# Patient Record
Sex: Female | Born: 1985
Health system: Southern US, Community
[De-identification: ages and names within clinical notes are randomized; demographics above are authoritative.]

## PROBLEM LIST (undated history)

## (undated) DIAGNOSIS — K5120 Ulcerative (chronic) proctitis without complications: Secondary | ICD-10-CM

## (undated) HISTORY — DX: Ulcerative (chronic) proctitis without complications: K51.20

---

## 2010-05-13 IMAGING — CT CT NECK W/ CM
3 series · 16 of 33 positions shown, 19 images · IV contrast (agent unspecified)
Comparison: None available.

CLINICAL DATA: Throat swelling.  Question mass.

CT NECK WITH CONTRAST
TECHNIQUE: Multidetector CT imaging of the neck was performed with
intravenous contrast.
Contrast: 80 ml [id number].

[Series 2: 2cc/(id)/1cc/(id) · axial · 0.47mm/px · z∈[-338,-136]mm · 8 of 64 slices shown, 10 images]
[im 5/64  soft-tissue]
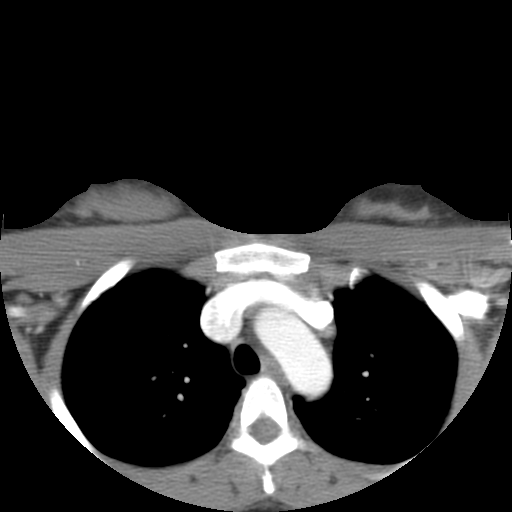
[im 5/64  bone]
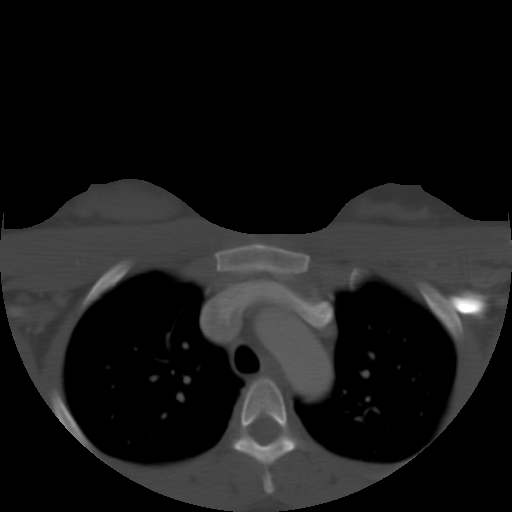
[im 15/64  bone]
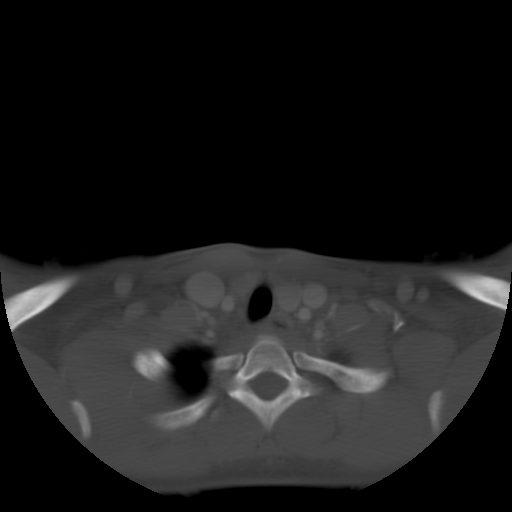
[im 20/64  bone]
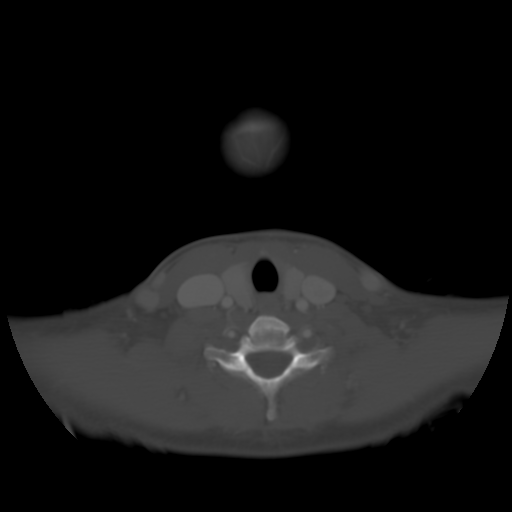
[im 30/64  bone]
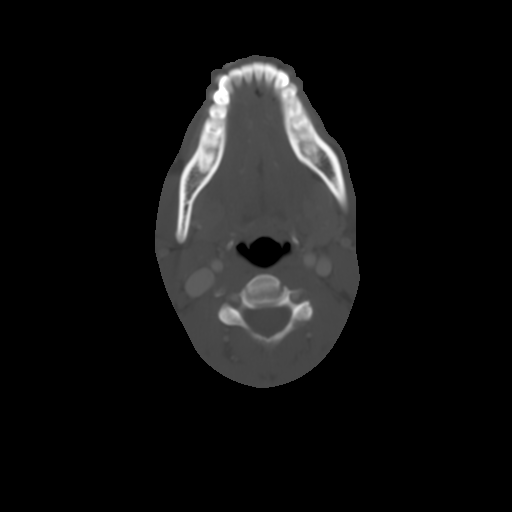
[im 34/64  soft-tissue]
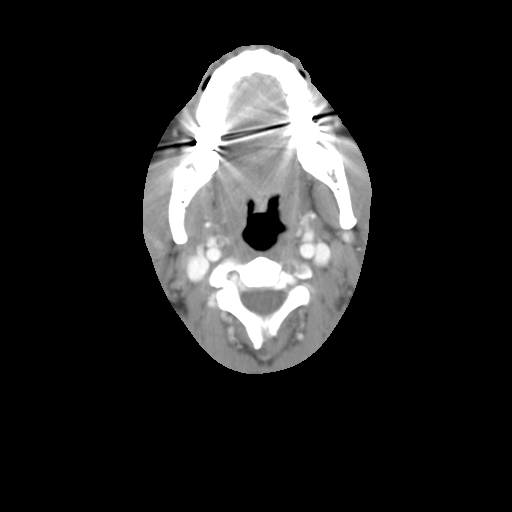
[im 34/64  bone]
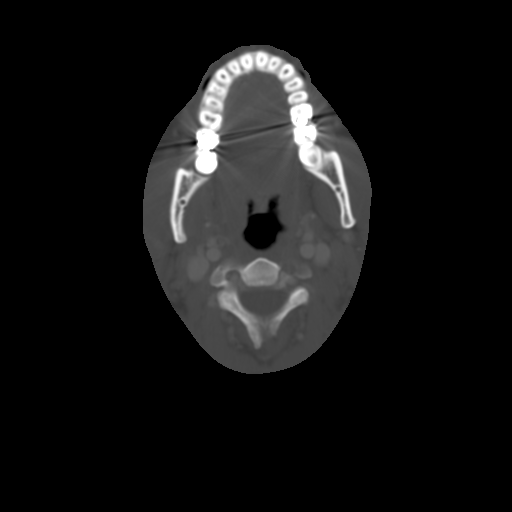
[im 44/64  bone]
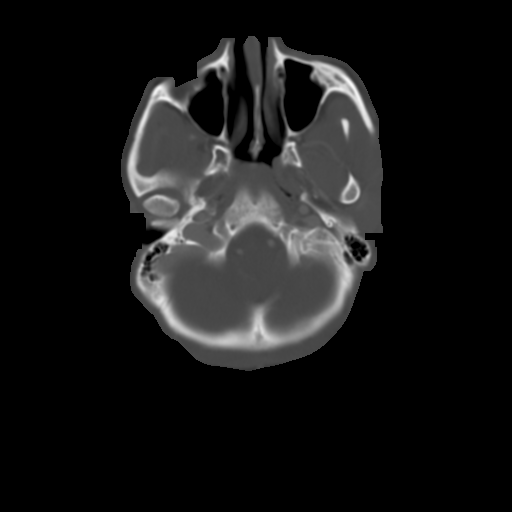
[im 49/64  bone]
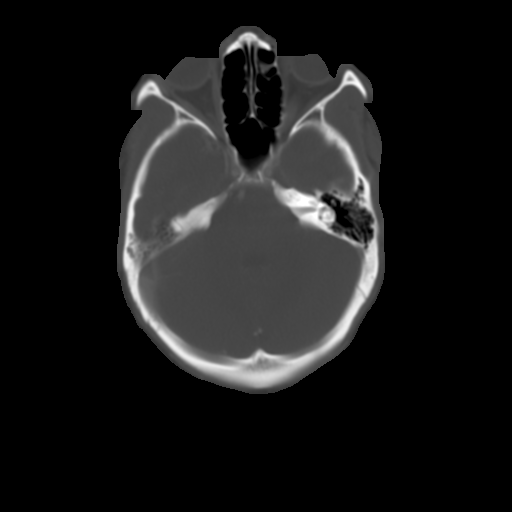
[im 59/64  bone]
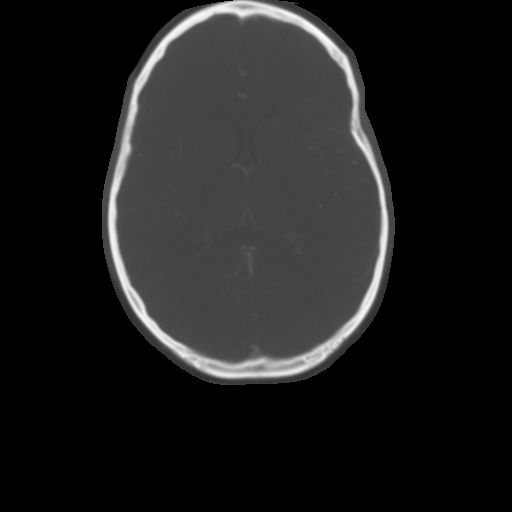

[Series 300: reformatted · coronal · 0.48mm/px · 3 of 108 slices shown (1 of 2)]
[im 37/108  bone]
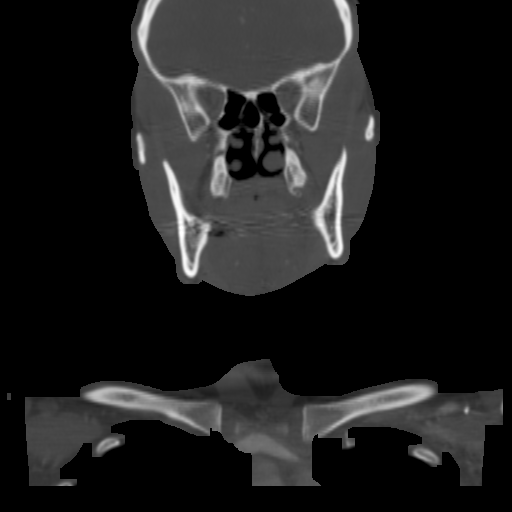
[im 48/108  bone]
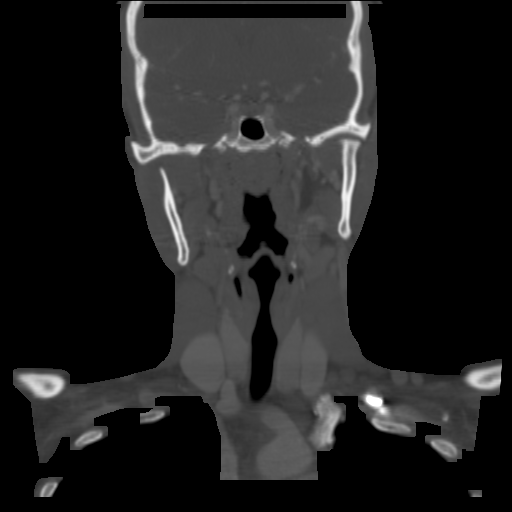
[im 59/108  bone]
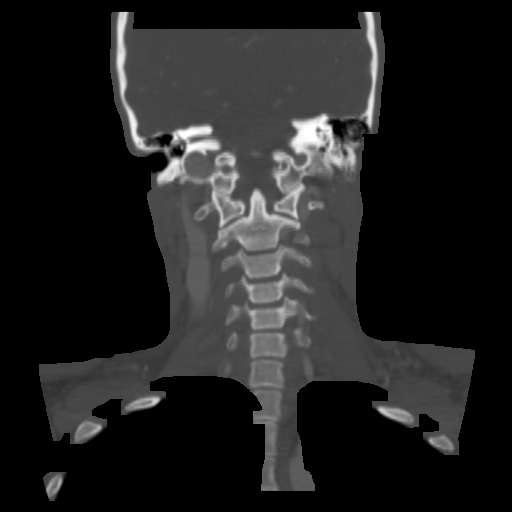

[Series 301: reformatted · sagittal · 0.48mm/px · 5 of 80 slices shown, 6 images (2 of 2)]
[im 27/80  bone]
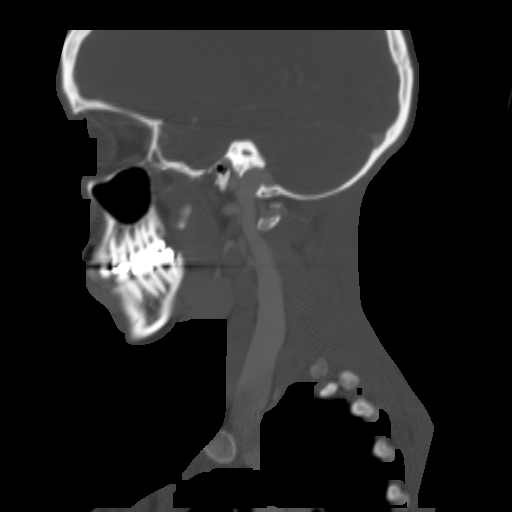
[im 33/80  bone]
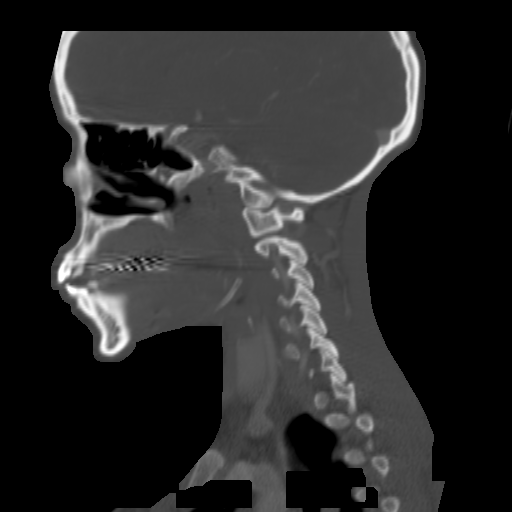
[im 40/80  soft-tissue]
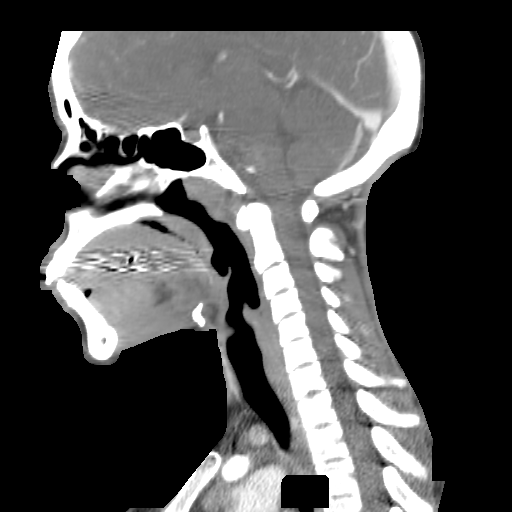
[im 40/80  bone]
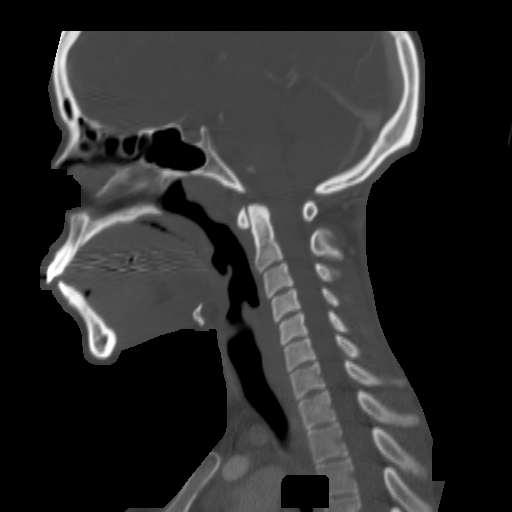
[im 47/80  bone]
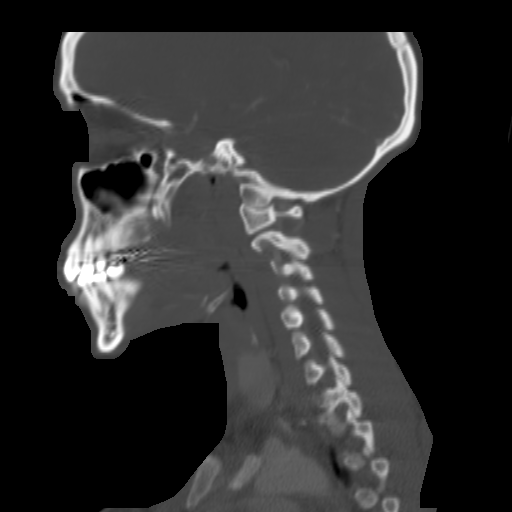
[im 53/80  bone]
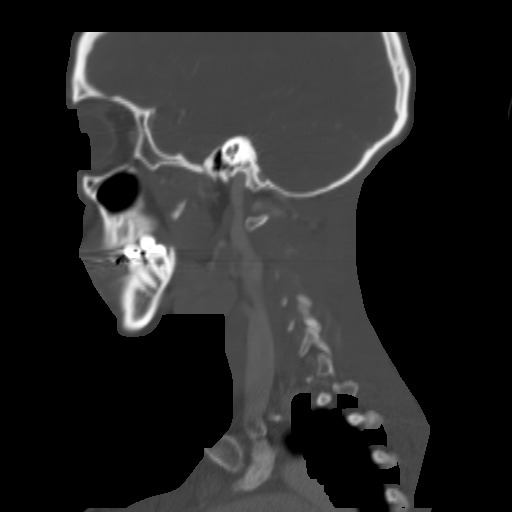

[16 of 33 positions shown; findings below may reference images not displayed]

FINDINGS: A few small lymph nodes are identified the neck but no
mass is seen.  The major salivary glands appear normal.  The
aerodigestive tract is unremarkable.  No abscess or other fluid
collection is identified.  Major caliber vascular structures are
patent.  The thyroid gland appears normal.  Visualized mediastinal
contents are unremarkable.  Imaged intracranial contents appear
normal.  Lung apices are clear.  There is no focal bony
abnormality.
IMPRESSION: Negative exam.  No mass identified.

## 2011-01-08 HISTORY — PX: KELOID EXCISION: SHX1856

## 2015-02-21 ENCOUNTER — Ambulatory Visit (FREE_STANDING_LABORATORY_FACILITY): Payer: No Typology Code available for payment source | Admitting: Family Medicine

## 2015-02-21 ENCOUNTER — Encounter (INDEPENDENT_AMBULATORY_CARE_PROVIDER_SITE_OTHER): Payer: Self-pay | Admitting: Family Medicine

## 2015-02-21 VITALS — BP 119/76 | HR 81 | Temp 98.5°F | Resp 12 | Ht 59.06 in | Wt 132.0 lb

## 2015-02-21 DIAGNOSIS — N6011 Diffuse cystic mastopathy of right breast: Secondary | ICD-10-CM

## 2015-02-21 DIAGNOSIS — Z113 Encounter for screening for infections with a predominantly sexual mode of transmission: Secondary | ICD-10-CM

## 2015-02-21 DIAGNOSIS — N6012 Diffuse cystic mastopathy of left breast: Secondary | ICD-10-CM

## 2015-02-21 DIAGNOSIS — Z0000 Encounter for general adult medical examination without abnormal findings: Secondary | ICD-10-CM

## 2015-02-21 LAB — HIV AG/AB 4TH GENERATION: HIV Ag/Ab, 4th Generation: NONREACTIVE

## 2015-02-21 LAB — COMPREHENSIVE METABOLIC PANEL
ALT: 18 U/L (ref 0–55)
AST (SGOT): 22 U/L (ref 5–34)
Albumin/Globulin Ratio: 1.3 (ref 0.9–2.2)
Albumin: 4 g/dL (ref 3.5–5.0)
Alkaline Phosphatase: 65 U/L (ref 37–106)
BUN: 25 mg/dL — ABNORMAL HIGH (ref 7.0–19.0)
Bilirubin, Total: 0.7 mg/dL (ref 0.1–1.2)
CO2: 25 meq/L (ref 21–30)
Calcium: 9.6 mg/dL (ref 8.5–10.5)
Chloride: 105 meq/L (ref 100–111)
Creatinine: 0.8 mg/dL (ref 0.4–1.5)
Globulin: 3.1 g/dL (ref 2.0–3.7)
Glucose: 74 mg/dL (ref 70–100)
Potassium: 4.1 meq/L (ref 3.5–5.3)
Protein, Total: 7.1 g/dL (ref 6.0–8.3)
Sodium: 139 meq/L (ref 135–146)

## 2015-02-21 LAB — LIPID PANEL
Cholesterol / HDL Ratio: 2.7
Cholesterol: 195 mg/dL (ref 0–199)
HDL: 73 mg/dL (ref 40–9999)
LDL Calculated: 114 mg/dL — ABNORMAL HIGH (ref 0–99)
Triglycerides: 38 mg/dL (ref 34–149)
VLDL Calculated: 8 mg/dL — ABNORMAL LOW (ref 10–40)

## 2015-02-21 LAB — HEMOLYSIS INDEX: Hemolysis Index: 10 (ref 0–18)

## 2015-02-21 LAB — CBC AND DIFFERENTIAL
Basophils Absolute Automated: 0.01 10*3/uL (ref 0.00–0.20)
Basophils Automated: 0 %
Eosinophils Absolute Automated: 0.07 10*3/uL (ref 0.00–0.70)
Eosinophils Automated: 2 %
Hematocrit: 38.8 % (ref 37.0–47.0)
Hgb: 13.2 g/dL (ref 12.0–16.0)
Immature Granulocytes Absolute: 0 10*3/uL
Immature Granulocytes: 0 %
Lymphocytes Absolute Automated: 1.86 10*3/uL (ref 0.50–4.40)
Lymphocytes Automated: 40 %
MCH: 31.2 pg (ref 28.0–32.0)
MCHC: 34 g/dL (ref 32.0–36.0)
MCV: 91.7 fL (ref 80.0–100.0)
MPV: 11.3 fL (ref 9.4–12.3)
Monocytes Absolute Automated: 0.33 10*3/uL (ref 0.00–1.20)
Monocytes: 7 %
Neutrophils Absolute: 2.35 10*3/uL (ref 1.80–8.10)
Neutrophils: 51 %
Nucleated RBC: 0 /100 WBC (ref 0–1)
Platelets: 274 10*3/uL (ref 140–400)
RBC: 4.23 10*6/uL (ref 4.20–5.40)
RDW: 13 % (ref 12–15)
WBC: 4.62 10*3/uL (ref 3.50–10.80)

## 2015-02-21 LAB — VITAMIN D,25 OH,TOTAL: Vitamin D, 25 OH, Total: 34 ng/mL (ref 30–100)

## 2015-02-21 LAB — GFR: EGFR: >60

## 2015-02-21 LAB — TSH: TSH: 1.43 u[IU]/mL (ref 0.35–4.94)

## 2015-02-21 LAB — HEPATITIS B SURFACE ANTIGEN W/ REFLEX TO CONFIRMATION: Hepatitis B Surface Antigen: NONREACTIVE

## 2015-02-21 NOTE — Progress Notes (Signed)
Subjective:    Date: 02/21/2015 8:47 AM   Patient ID: Katherine Evans is a 30 y.o. female.    Chief Complaint   Patient presents with   . Annual Exam     physical      HPI  New patient.  Annual physical.   Exercise -Cross fit 4 times a week.   Diet: "clean" diet except once or twice a week.  Glass of wine once a day.  Gyn:  Not sexually active at this time.  No previous pregnancy.  Regular menses.  Immunizations: pt states had tetanus vaccine within 10 years, will provide records.    There are no active problems to display for this patient.    No current outpatient prescriptions on file.     No current facility-administered medications for this visit.     No Known Allergies     History reviewed. No pertinent past medical history.     Past Surgical History   Procedure Laterality Date   . Cosmetic surgery Bilateral 2013     ears     Family History   Problem Relation Age of Onset   . No known problems Mother    . No known problems Father    . Diabetes Paternal Grandfather    . Heart disease Paternal Grandfather    . Inflammatory bowel disease Paternal Grandfather      Social History     Social History   . Marital Status: Single     Spouse Name: N/A   . Number of Children: N/A   . Years of Education: N/A     Occupational History   . Not on file.     Social History Main Topics   . Smoking status: Never Smoker    . Smokeless tobacco: Never Used   . Alcohol Use: Yes      Comment: occassional wine   . Drug Use: No   . Sexual Activity:     Partners: Male     Other Topics Concern   . Not on file     Social History Narrative   . No narrative on file     The following portions of the patient's history were reviewed and updated as appropriate: allergies, current medications, past family history, past medical history, past social history, past surgical history and problem list.    Review of Systems  ROS performed (handout given - General sypmtoms, eyes, ears/nose/mouth & throat, breasts, lungs & heart, skin, neurological,  abdomen, sleep, musculoskeletal, mood, men only, women only, period questions, other) and positives as above (none)    Objective:   BP 119/76 mmHg  Pulse 81  Temp(Src) 98.5 F (36.9 C) (Oral)  Resp 12  Ht 1.5 m (4' 11.06")  Wt 59.875 kg (132 lb)  BMI 26.61 kg/m2  LMP 02/06/2015  Wt Readings from Last 3 Encounters:   02/21/15 59.875 kg (132 lb)     Physical Exam   Constitutional: She is oriented to person, place, and time. She appears well-developed and well-nourished. She is cooperative. No distress.   HENT:   Head: Normocephalic and atraumatic.   Right Ear: Hearing, tympanic membrane, external ear and ear canal normal.   Left Ear: Hearing, tympanic membrane, external ear and ear canal normal.   Mouth/Throat: Uvula is midline, oropharynx is clear and moist and mucous membranes are normal.   Eyes: Conjunctivae and EOM are normal. Pupils are equal, round, and reactive to light.   Neck: Trachea normal. Neck supple.  Carotid bruit is not present. No edema present. No thyroid mass and no thyromegaly present.   Cardiovascular: Normal rate, regular rhythm, S1 normal, S2 normal and intact distal pulses.    No murmur heard.  Pulmonary/Chest: Effort normal and breath sounds normal. She has no decreased breath sounds. She has no wheezes. She has no rhonchi. She has no rales. Right breast exhibits no inverted nipple and no nipple discharge. Left breast exhibits no inverted nipple and no nipple discharge. Breasts are symmetrical.   Fibrocystic breast tissue texture   Abdominal: Soft. Normal appearance and bowel sounds are normal. She exhibits no distension and no mass. There is no hepatosplenomegaly. There is no tenderness.   Lymphadenopathy:     She has no cervical adenopathy.     She has no axillary adenopathy.        Right axillary: No pectoral and no lateral adenopathy present.        Left axillary: No pectoral and no lateral adenopathy present.       Right: No supraclavicular adenopathy present.        Left: No  supraclavicular adenopathy present.   Neurological: She is alert and oriented to person, place, and time. She has normal reflexes.   Skin: Skin is warm, dry and intact. No rash noted. She is not diaphoretic.   Psychiatric: She has a normal mood and affect. Her speech is normal.   Nursing note and vitals reviewed.        Assessment/Plan:       1. Annual physical exam    2. Screening for STD (sexually transmitted disease)    3. Fibrocystic breast changes of both breasts      - CBC and differential  - Comprehensive metabolic panel  - Lipid panel  - Vitamin D,25 OH, Total  - TSH  - HIV Ag/Ab 4th generation  - CHLAMYDIA/GC BY PCR  - Hepatitis B (HBV) Surface Antigen    Further recommendations pending test results.   Handout given to patient on health maintenance/preventive care [facility] 2014; regular health exams, dental and eye exams,patient's BMI and BMI categories, recommended amount of ETOH; healthy diet, regular exercise, maintenance of healthy weight, avoidance of tobacco and street drugs, age specific preventive care, age and disease specific vaccine recommendations, and age specific cancer screening)      Return in about 1 year (around 02/21/2016), or if symptoms worsen or fail to improve, for annual physical.    Jules Husbands, DO

## 2015-02-21 NOTE — Patient Instructions (Signed)
Preventive Care for Adults, Female  [employer] Patient Information 2014 [employer]  Last revised: December 21, 2012  A healthy lifestyle and preventive care can promote health and wellness. Preventive health guidelines for women include the following key practices.  A routine yearly physical is a good way to check with your health care provider about your health and preventive screening. It is a chance to share any concerns and updates on your health and to receive a thorough exam.  Visit your dentist for a routine exam and preventive care every 6 months. Brush your teeth twice a day and floss once a day. Good oral hygiene prevents tooth decay and gum disease.  The frequency of eye exams is based on your age, health, family medical history, use of contact lenses, and other factors. Follow your health care provider's recommendations for frequency of eye exams.  Eat a healthy diet. Foods like vegetables, fruits, whole grains, low-fat dairy products, and lean protein foods contain the nutrients you need without too many calories. Decrease your intake of foods high in solid fats, added sugars, and salt. Eat the right amount of calories for you. Get information about a proper diet from your health care provider, if necessary.  Regular physical exercise is one of the most important things you can do for your health. Most adults should get at least 150 minutes of moderate-intensity exercise (any activity that increases your heart rate and causes you to sweat) each week. In addition, most adults need muscle-strengthening exercises on 2 or more days a week.  Maintain a healthy weight. The body mass index (BMI) is a screening tool to identify possible weight problems. It provides an estimate of body fat based on height and weight. Your health care provider can find your BMI, and can help you achieve or maintain a healthy weight. For adults 20 years and older:  A BMI below 18.5 is considered underweight.  A BMI of 18.5 to  24.9 is normal.  A BMI of 25 to 29.9 is considered overweight.  A BMI of 30 and above is considered obese.  Maintain normal blood lipids and cholesterol levels by exercising and minimizing your intake of saturated fat. Eat a balanced diet with plenty of fruit and vegetables. Blood tests for lipids and cholesterol should begin at age 20 and be repeated every 5 years. If your lipid or cholesterol levels are high, you are over 50, or you are at high risk for heart disease, you may need your cholesterol levels checked more frequently. Ongoing high lipid and cholesterol levels should be treated with medicines if diet and exercise are not working.  If you smoke, find out from your health care provider how to quit. If you do not use tobacco, do notstart.  Lung cancer screening is recommended for adults aged 55-80 years who are at high risk for developing lung cancer because of a history of smoking. A yearly low-dose CT scan of the lungs is recommended for people who have at least a 30-pack-year history of smoking and are a current smoker or have quit within the past 15 years. A pack year of smoking is smoking an average of 1 pack of cigarettes a day for 1 year (for example: 1 pack a day for 30 years or 2 packs a day for 15 years). Yearly screening should continue until the smoker has stopped smoking for at least 15 years. Yearly screening should be stopped for people who develop a health problem that would prevent them from   having lung cancer treatment.  If you are pregnant, do notdrink alcohol. If you are breastfeeding, be very cautious about drinking alcohol. If you are not pregnant and choose to drink alcohol, do nothave more than 1 drink per day. One drink is considered to be 12 ounces (355 mL) of beer, 5 ounces (148 mL) of wine, or 1.5 ounces (44 mL) of liquor.  Avoid use of street drugs. Do notshare needles with anyone. Ask for help if you need support or instructions about stopping the use of drugs.  High blood  pressure causes heart disease and increases the risk of stroke. Your blood pressure should be checked at least every 1 to 2 years. Ongoing high blood pressure should be treated with medicines if weight loss and exercise do not work.  If you are 55-79 years old, ask your health care provider if you should take aspirin to prevent strokes.  Diabetes screening involves taking a blood sample to check your fasting blood sugar level. This should be done once every 3 years, after age 45, if you are within normal weight and without risk factors for diabetes. Testing should be considered at a younger age or be carried out more frequently if you are overweight and have at least 1 risk factor for diabetes.  Breast cancer screening is essential preventive care for women. You should practice "breast self-awareness." This means understanding the normal appearance and feel of your breasts and may include breast self-examination. Any changes detected, no matter how small, should be reported to a health care provider. Women in their 20s and 30s should have a clinical breast exam (CBE) by a health care provider as part of a regular health exam every 1 to 3 years. After age 40, women should have a CBE every year. Starting at age 40, women should consider having a mammogram (breast X-ray test) every year. Women who have a family history of breast cancer should talk to their health care provider about genetic screening. Women at a high risk of breast cancer should talk to their health care providers about having an MRI and a mammogram every year.  Breast cancer gene (BRCA)-related cancer risk assessment is recommended for women who have family members withBRCA-related cancers.BRCA-related cancers include breast, ovarian, tubal, and peritoneal cancers. Having family members with these cancers may be associated with an increased risk for harmful changes (mutations) in the breast cancer genesBRCA1andBRCA2. Results of the assessment will  determine the need for genetic counseling andBRCA1andBRCA2testing.  The Pap test is a screening test for cervical cancer. A Pap test can show cell changes on the cervix that might become cervical cancer if left untreated. A Pap test is a procedure in which cells are obtained and examined from the lower end of the uterus (cervix).  Women should have a Pap test starting at age 21.  Between ages 21 and 29, Pap tests should be repeated every 2 years.  Beginning at age 30, you should have a Pap test every 3 years as long as the past 3 Pap tests have been normal.  Some women have medical problems that increase the chance of getting cervical cancer. Talk to your health care provider about these problems. It is especially important to talk to your health care provider if a new problem develops soon after your last Pap test. In these cases, your health care provider may recommend more frequent screening and Pap tests.  The above recommendations are the same for women who haveor have notgotten the vaccine   for human papillomavirus (HPV).  If you had a hysterectomy for a problem that was notcancer or a condition that could lead to cancer, then you no longer need Pap tests. Even if you no longer need a Pap test, a regular exam is a good idea to make sure no other problems are starting.  If you are between ages 65 and 70 years, and you have had normal Pap tests going back 10 years, you no longer need Pap tests. Even if you no longer need a Pap test, a regular exam is a good idea to make sure no other problems are starting.  If you have had past treatment for cervical cancer or a condition that could lead to cancer, you need Pap tests and screening for cancer for at least 20 years after your treatment.  If Pap tests have been discontinued, risk factors (such as a new sexual partner) need to be reassessed to determine if screening should be resumed.  The HPV test is an additional test that may be used for cervical cancer screening.  The HPV test looks for the virus that can cause the cell changes on the cervix. The cells collected during the Pap test can be tested for HPV. The HPV test could be used to screen women aged 30 years and older, and should be used in women of any age who have unclear Pap test results. After the age of 30, women should have HPV testing at the same frequency as a Pap test.  Colorectal cancer can be detected and often prevented. Most routine colorectal cancer screening begins at the age of 50 years and continues through age 75 years. However, your health care provider may recommend screening at an earlier age if you have risk factors for colon cancer. On a yearly basis, your health care provider may provide home test kits to check for hidden blood in the stool. Use of a small camera at the end of a tube, to directly examine the colon (sigmoidoscopy or colonoscopy), can detect the earliest forms of colorectal cancer. Talk to your health care provider about this at age 50, when routine screening begins. Direct exam of the colon should be repeated every 5-10 years through age 75 years, unless early forms of pre-cancerous polyps or small growths are found.  People who are at an increased risk for hepatitis B should be screened for this virus. You are considered at high risk for hepatitis B if:  You were born in a country where hepatitis B occurs often. Talk with your health care provider about which countries are considered high risk.  Your parents were born in a high-risk country and you have not received a shot to protect against hepatitis B (hepatitis B vaccine).  You have HIV or AIDS.  You use needles to inject street drugs.  You live with, or have sex with, someone who has Hepatitis B.  You get hemodialysis treatment.  You take certain medicines for conditions like cancer, organ transplantation, and autoimmune conditions.  Hepatitis C blood testing is recommended for all people born from 1945 through 1965 and any  individual with known risks for hepatitis C.  Practice safe sex. Use condoms and avoid high-risk sexual practices to reduce the spread of sexually transmitted infections (STIs). STIs include gonorrhea, chlamydia, syphilis, trichomonas, herpes, HPV, and human immunodeficiency virus (HIV). Herpes, HIV, and HPV are viral illnesses that have no cure. They can result in disability, cancer, and death. Sexually active women aged 25 years and   younger should be checked for chlamydia. Older women with new or multiple partners should also be tested for chlamydia. Testing for other STIs is recommended if you are sexually active and at increased risk.  Osteoporosis is a disease in which the bones lose minerals and strength with aging. This can result in serious bone fractures or breaks. The risk of osteoporosis can be identified using a bone density scan. Women ages 65 years and over and women at risk for fractures or osteoporosis should discuss screening with their health care providers. Ask your health care provider whether you should take a calcium supplement or vitamin D to reduce the rate of osteoporosis.  Menopause can be associated with physical symptoms and risks. Hormone replacement therapy is available to decrease symptoms and risks. You should talk to your health care provider about whether hormone replacement therapy is right for you.  Use sunscreen. Apply sunscreen liberally and repeatedly throughout the day. You should seek shade when your shadow is shorter than you. Protect yourself by wearing long sleeves, pants, a wide-brimmed hat, and sunglasses year round, whenever you are outdoors.  Once a month, do a whole body skin exam, using a mirror to look at the skin on your back. Tell your health care provider of new moles, moles that have irregular borders, moles that are larger than a pencil eraser, or moles that have changed in shape or color.  Stay current with required vaccines (immunizations).  Influenza  vaccine. All adults should be immunized every year.  Tetanus, diphtheria, and acellular pertussis (Td, Tdap) vaccine. Pregnant women should receive 1 dose of Tdap vaccine during each pregnancy. The dose should be obtained regardless of the length of time since the last dose. Immunization is preferred during the 27th-36th week of gestation. An adult who has not previously received Tdap or who does not know her vaccine status should receive 1 dose of Tdap. This initial dose should be followed by tetanus and diphtheria toxoids (Td) booster doses every 10 years. Adults with an unknown or incomplete history of completing a 3-dose immunization series with Td-containing vaccines should begin or complete a primary immunization series including a Tdap dose. Adults should receive a Td booster every 10 years.  Varicella vaccine. An adult without evidence of immunity to varicella should receive 2 doses or a second dose if she has previously received 1 dose. Pregnant females who do not have evidence of immunity should receive the first dose after pregnancy. This first dose should be obtained before leaving the health care facility. The second dose should be obtained 4-8 weeks after the first dose.  Human papillomavirus (HPV) vaccine. Females aged 13-26 years who have not received the vaccine previously should obtain the 3-dose series. The vaccine is not recommended for use in pregnant females. However, pregnancy testing is not needed before receiving a dose. If a female is found to be pregnant after receiving a dose, no treatment is needed. In that case, the remaining doses should be delayed until after the pregnancy. Immunization is recommended for any person with an immunocompromised condition through the age of 26 years if she did not get any or all doses earlier. During the 3-dose series, the second dose should be obtained 4-8 weeks after the first dose. The third dose should be obtained 24 weeks after the first dose and 16  weeks after the second dose.  Zoster vaccine. One dose is recommended for adults aged 60 years or older unless certain conditions are present.  Measles, mumps,   and rubella (MMR) vaccine. Adults born before 1957 generally are considered immune to measles and mumps. Adults born in 1957 or later should have 1 or more doses of MMR vaccine unless there is a contraindication to the vaccine or there is laboratory evidence of immunity to each of the three diseases. A routine second dose of MMR vaccine should be obtained at least 28 days after the first dose for students attending postsecondary schools, health care workers, or international travelers. People who received inactivated measles vaccine or an unknown type of measles vaccine during 1963-1967 should receive 2 doses of MMR vaccine. People who received inactivated mumps vaccine or an unknown type of mumps vaccine before 1979 and are at high risk for mumps infection should consider immunization with 2 doses of MMR vaccine. For females of childbearing age, rubella immunity should be determined. If there is no evidence of immunity, females who are not pregnant should be vaccinated. If there is no evidence of immunity, females who are pregnant should delay immunization until after pregnancy. Unvaccinated health care workers born before 1957 who lack laboratory evidence of measles, mumps, or rubella immunity or laboratory confirmation of disease should consider measles and mumps immunization with 2 doses of MMR vaccine or rubella immunization with 1 dose of MMR vaccine.  Pneumococcal 13-valent conjugate (PCV13) vaccine. When indicated, a person who is uncertain of her immunization history and has no record of immunization should receive the PCV13 vaccine. An adult aged 19 years or older who has certain medical conditions and has not been previously immunized should receive 1 dose of PCV13 vaccine. This PCV13 should be followed with a dose of pneumococcal polysaccharide  (PPSV23) vaccine. The PPSV23 vaccine dose should be obtained at least 8 weeks after the dose of PCV13 vaccine. An adult aged 19 years or older who has certain medical conditions and previously received 1 or more doses of PPSV23 vaccine should receive 1 dose of PCV13. The PCV13 vaccine dose should be obtained 1 or more years after the last PPSV23 vaccine dose.  Pneumococcal polysaccharide (PPSV23) vaccine. When PCV13 is also indicated, PCV13 should be obtained first. All adults aged 65 years and older should be immunized. An adult younger than age 65 years who has certain medical conditions should be immunized. Any person who resides in a nursing home or long-term care facility should be immunized. An adult smoker should be immunized. People with an immunocompromised condition and certain other conditions should receive both PCV13 and PPSV23 vaccines. People with human immunodeficiency virus (HIV) infection should be immunized as soon as possible after diagnosis. Immunization during chemotherapy or radiation therapy should be avoided. Routine use of PPSV23 vaccine is not recommended for American Indians, [place] Natives, or people younger than 65 years unless there are medical conditions that require PPSV23 vaccine. When indicated, people who have unknown immunization and have no record of immunization should receive PPSV23 vaccine. One-time revaccination 5 years after the first dose of PPSV23 is recommended for people aged 19-64 years who have chronic kidney failure, nephrotic syndrome, asplenia, or immunocompromised conditions. People who received 1-2 doses of PPSV23 before age 65 years should receive another dose of PPSV23 vaccine at age 65 years or later if at least 5 years have passed since the previous dose. Doses of PPSV23 are not needed for people immunized with PPSV23 at or after age 65 years.  Meningococcal vaccine. Adults with asplenia or persistent complement component deficiencies should receive 2 doses  of quadrivalent meningococcal conjugate (MenACWY-D) vaccine. The doses   should be obtained at least 2 months apart. Microbiologists working with certain meningococcal bacteria, military recruits, people at risk during an outbreak, and people who travel to or live in countries with a high rate of meningitis should be immunized. A first-year college student up through age 21 years who is living in a residence hall should receive a dose if she did not receive a dose on or after her 16th birthday. Adults who have certain high-risk conditions should receive one or more doses of vaccine.  Hepatitis A vaccine. Adults who wish to be protected from this disease, have certain high-risk conditions, work with hepatitis A-infected animals, work in hepatitis A research labs, or travel to or work in countries with a high rate of hepatitis A should be immunized. Adults who were previously unvaccinated and who anticipate close contact with an international adoptee during the first 60 days after arrival in the [place] from a country with a high rate of hepatitis A should be immunized.  Hepatitis B vaccine. Adults who wish to be protected from this disease, have certain high-risk conditions, may be exposed to blood or other infectious body fluids, are household contacts or sex partners of hepatitis B positive people, are clients or workers in certain care facilities, or travel to or work in countries with a high rate of hepatitis B should be immunized.  Haemophilus influenzaetype b (Hib) vaccine. A previously unvaccinated person with asplenia or sickle cell disease or having a scheduled splenectomy should receive 1 dose of Hib vaccine. Regardless of previous immunization, a recipient of a hematopoietic stem cell transplant should receive a 3-dose series 6-12 months after her successful transplant. Hib vaccine is not recommended for adults with HIV infection.  Preventive Services / Frequency  Ages 19 to 39 years  Blood pressure  check.** / Every 1 to 2 years.  Lipid and cholesterol check.** / Every 5 years beginning at age 20.  Clinical breast exam.** / Every 3 years for women in their 20s and 30s.  BRCA-related cancer risk assessment.** / For women who have family members with aBRCA-related cancer (breast, ovarian, tubal, or peritoneal cancers).  Pap test.** / Every 2 years from ages 21 through 29. Every 3 years starting at age 30 through age 65 or 70 with a history of 3 consecutive normal Pap tests.  HPV screening.** / Every 3 years from ages 30 through ages 65 to 70 with a history of 3 consecutive normal Pap tests.  Hepatitis C blood test.** / For any individual with known risks for hepatitis C.  Skin self-exam. / Monthly.  Influenza vaccine. / Every year.  Tetanus, diphtheria, and acellular pertussis (Tdap, Td) vaccine.** / Consult your health care provider. Pregnant women should receive 1 dose of Tdap vaccine during each pregnancy. 1 dose of Td every 10 years.  Varicella vaccine.** / Consult your health care provider. Pregnant females who do not have evidence of immunity should receive the first dose after pregnancy.  HPV vaccine. / 3 doses over 6 months, if 26 and younger. The vaccine is not recommended for use in pregnant females. However, pregnancy testing is not needed before receiving a dose.  Measles, mumps, rubella (MMR) vaccine.** / You need at least 1 dose of MMR if you were born in 1957 or later. You may also need a 2nddose. For females of childbearing age, rubella immunity should be determined. If there is no evidence of immunity, females who are not pregnant should be vaccinated. If there is no evidence of   immunity, females who are pregnant should delay immunization until after pregnancy.  Pneumococcal 13-valent conjugate (PCV13) vaccine.** / Consult your health care provider.  Pneumococcal polysaccharide (PPSV23) vaccine.** / 1 to 2 doses if you smoke cigarettes or if you have certain conditions.  Meningococcal vaccine.**  / 1 dose if you are age 19 to 21 years and a first-year college student living in a residence hall, or have one of several medical conditions, you need to get vaccinated against meningococcal disease. You may also need additional booster doses.  Hepatitis A vaccine.** / Consult your health care provider.  Hepatitis B vaccine.** / Consult your health care provider.  Haemophilus influenzaetype b (Hib) vaccine.** / Consult your health care provider.  Ages 40 to 64 years  Blood pressure check.** / Every 1 to 2 years.  Lipid and cholesterol check.** / Every 5 years beginning at age 20 years.  Lung cancer screening. / Every year if you are aged 55-80 years and have a 30-pack-year history of smoking and currently smoke or have quit within the past 15 years. Yearly screening is stopped once you have quit smoking for at least 15 years or develop a health problem that would prevent you from having lung cancer treatment.  Clinical breast exam.** / Every year after age 40 years.  BRCA-related cancer risk assessment.** / For women who have family members with aBRCA-related cancer (breast, ovarian, tubal, or peritoneal cancers).  Mammogram.** / Every year beginning at age 40 years and continuing for as long as you are in good health. Consult with your health care provider.  Pap test.** / Every 3 years starting at age 30 years through age 65 or 70 years with a history of 3 consecutive normal Pap tests.  HPV screening.** / Every 3 years from ages 30 years through ages 65 to 70 years with a history of 3 consecutive normal Pap tests.  Fecal occult blood test (FOBT) of stool. / Every year beginning at age 50 years and continuing until age 75 years. You may not need to do this test if you get a colonoscopy every 10 years.  Flexible sigmoidoscopy or colonoscopy.** / Every 5 years for a flexible sigmoidoscopy or every 10 years for a colonoscopy beginning at age 50 years and continuing until age 75 years.  Hepatitis C blood test.** / For  all people born from 1945 through 1965 and any individual with known risks for hepatitis C.  Skin self-exam. / Monthly.  Influenza vaccine. / Every year.  Tetanus, diphtheria, and acellular pertussis (Tdap/Td) vaccine.** / Consult your health care provider. Pregnant women should receive 1 dose of Tdap vaccine during each pregnancy. 1 dose of Td every 10 years.  Varicella vaccine.** / Consult your health care provider. Pregnant females who do not have evidence of immunity should receive the first dose after pregnancy.  Zoster vaccine.** / 1 dose for adults aged 60 years or older.  Measles, mumps, rubella (MMR) vaccine.** / You need at least 1 dose of MMR if you were born in 1957 or later. You may also need a 2nddose. For females of childbearing age, rubella immunity should be determined. If there is no evidence of immunity, females who are not pregnant should be vaccinated. If there is no evidence of immunity, females who are pregnant should delay immunization until after pregnancy.  Pneumococcal 13-valent conjugate (PCV13) vaccine.** / Consult your health care provider.  Pneumococcal polysaccharide (PPSV23) vaccine.** / 1 to 2 doses if you smoke cigarettes or if you have certain   conditions.  Meningococcal vaccine.** / Consult your health care provider.  Hepatitis A vaccine.** / Consult your health care provider.  Hepatitis B vaccine.** / Consult your health care provider.  Haemophilus influenzaetype b (Hib) vaccine.** / Consult your health care provider.  Ages 65 years and over  Blood pressure check.** / Every 1 to 2 years.  Lipid and cholesterol check.** / Every 5 years beginning at age 20 years.  Lung cancer screening. / Every year if you are aged 55-80 years and have a 30-pack-year history of smoking and currently smoke or have quit within the past 15 years. Yearly screening is stopped once you have quit smoking for at least 15 years or develop a health problem that would prevent you from having lung cancer  treatment.  Clinical breast exam.** / Every year after age 40 years.  BRCA-related cancer risk assessment.** / For women who have family members with aBRCA-related cancer (breast, ovarian, tubal, or peritoneal cancers).  Mammogram.** / Every year beginning at age 40 years and continuing for as long as you are in good health. Consult with your health care provider.  Pap test.** / Every 3 years starting at age 30 years through age 65 or 70 years with 3 consecutive normal Pap tests. Testing can be stopped between 65 and 70 years with 3 consecutive normal Pap tests and no abnormal Pap or HPV tests in the past 10 years.  HPV screening.** / Every 3 years from ages 30 years through ages 65 or 70 years with a history of 3 consecutive normal Pap tests. Testing can be stopped between 65 and 70 years with 3 consecutive normal Pap tests and no abnormal Pap or HPV tests in the past 10 years.  Fecal occult blood test (FOBT) of stool. / Every year beginning at age 50 years and continuing until age 75 years. You may not need to do this test if you get a colonoscopy every 10 years.  Flexible sigmoidoscopy or colonoscopy.** / Every 5 years for a flexible sigmoidoscopy or every 10 years for a colonoscopy beginning at age 50 years and continuing until age 75 years.  Hepatitis C blood test.** / For all people born from 1945 through 1965 and any individual with known risks for hepatitis C.  Osteoporosis screening.** / A one-time screening for women ages 65 years and over and women at risk for fractures or osteoporosis.  Skin self-exam. / Monthly.  Influenza vaccine. / Every year.  Tetanus, diphtheria, and acellular pertussis (Tdap/Td) vaccine.** / 1 dose of Td every 10 years.  Varicella vaccine.** / Consult your health care provider.  Zoster vaccine.** / 1 dose for adults aged 60 years or older.  Pneumococcal 13-valent conjugate (PCV13) vaccine.** / Consult your health care provider.  Pneumococcal polysaccharide (PPSV23) vaccine.** / 1  dose for all adults aged 65 years and older.  Meningococcal vaccine.** / Consult your health care provider.  Hepatitis A vaccine.** / Consult your health care provider.  Hepatitis B vaccine.** / Consult your health care provider.  Haemophilus influenzaetype b (Hib) vaccine.** / Consult your health care provider.  ** Family history and personal history of risk and conditions may change your health care provider's recommendations.

## 2015-02-22 LAB — RPR (REFLEX TO TITER AND CONFIRMATION): RPR: NONREACTIVE

## 2015-03-01 ENCOUNTER — Other Ambulatory Visit (INDEPENDENT_AMBULATORY_CARE_PROVIDER_SITE_OTHER): Payer: Self-pay | Admitting: Family Medicine

## 2015-03-01 DIAGNOSIS — A749 Chlamydial infection, unspecified: Secondary | ICD-10-CM

## 2015-03-01 MED ORDER — AZITHROMYCIN 500 MG PO TABS
1000.0000 mg | ORAL_TABLET | Freq: Once | ORAL | Status: AC
Start: 2015-03-01 — End: 2015-03-01

## 2015-03-10 ENCOUNTER — Telehealth (INDEPENDENT_AMBULATORY_CARE_PROVIDER_SITE_OTHER): Payer: Self-pay | Admitting: Family Medicine

## 2015-03-10 NOTE — Telephone Encounter (Signed)
Advised pt, she states she will make an appointment.

## 2015-03-10 NOTE — Telephone Encounter (Signed)
S/w pt, she completed antibiotics last week and has no other sx but did notice a very very faint light brown vaginal discharge-please advise. Pt also wants to know how soon she can return for recheck.

## 2015-03-10 NOTE — Telephone Encounter (Signed)
Recommend that she come in for eval for new symptom - vaginal discharge.  We can recheck chlamydia/STD testing at that time.

## 2015-03-10 NOTE — Telephone Encounter (Signed)
Pt has question regarding her antibiotic, she is not sure if her symptoms are cleared up. She would like a call back please at  [phone]

## 2015-03-17 ENCOUNTER — Ambulatory Visit (INDEPENDENT_AMBULATORY_CARE_PROVIDER_SITE_OTHER): Payer: No Typology Code available for payment source | Admitting: Family Medicine

## 2015-03-21 ENCOUNTER — Ambulatory Visit (INDEPENDENT_AMBULATORY_CARE_PROVIDER_SITE_OTHER): Payer: No Typology Code available for payment source | Admitting: Family Medicine

## 2015-04-04 ENCOUNTER — Ambulatory Visit (FREE_STANDING_LABORATORY_FACILITY): Payer: No Typology Code available for payment source | Admitting: Family Medicine

## 2015-04-04 ENCOUNTER — Encounter (INDEPENDENT_AMBULATORY_CARE_PROVIDER_SITE_OTHER): Payer: Self-pay | Admitting: Family Medicine

## 2015-04-04 DIAGNOSIS — A749 Chlamydial infection, unspecified: Secondary | ICD-10-CM

## 2015-04-04 NOTE — Progress Notes (Signed)
Subjective:    Date: 04/04/2015 8:36 PM   Patient ID: Katherine Evans is a 30 y.o. female.    Chief Complaint   Patient presents with   . Exposure to STD     f/u post med completion, retest (change future order to now)     HPI Pt presents for f/u.  Was not sexually active for some time, but had positive chlamydia test. Completed tx. Has not been sexually active since tx.  Denies vaginal discharge, dysuria, pelvic pain, rash. Malaise, fatigue.    There are no active problems to display for this patient.    No current outpatient prescriptions on file.     No current facility-administered medications for this visit.     No Known Allergies  History reviewed. No pertinent past medical history.  Past Surgical History   Procedure Laterality Date   . Cosmetic surgery Bilateral 2013     ears     Family History   Problem Relation Age of Onset   . No known problems Mother    . No known problems Father    . Diabetes Paternal Grandfather    . Heart disease Paternal Grandfather    . Inflammatory bowel disease Paternal Grandfather      Social History     Social History   . Marital Status: Single     Spouse Name: N/A   . Number of Children: N/A   . Years of Education: N/A     Occupational History   . Not on file.     Social History Main Topics   . Smoking status: Never Smoker    . Smokeless tobacco: Never Used   . Alcohol Use: Yes      Comment: occassional wine   . Drug Use: No   . Sexual Activity:     Partners: Male     Other Topics Concern   . Not on file     Social History Narrative     The following portions of the patient's history were reviewed and updated as appropriate: allergies, current medications, past family history, past medical history, past social history, past surgical history and problem list.    Review of Systems  See HPI    Objective:   BP 128/76 mmHg  Pulse 82  Temp(Src) 97.9 F (36.6 C) (Oral)  Resp 16  Ht 1.499 m (4\' 11" )  Wt 61.689 kg (136 lb)  BMI 27.45 kg/m2  LMP 03/16/2015  Wt Readings from Last 3  Encounters:   04/04/15 61.689 kg (136 lb)   02/21/15 59.875 kg (132 lb)     Physical Exam   Constitutional: She appears well-developed and well-nourished. No distress.   Skin: She is not diaphoretic.   Nursing note and vitals reviewed.        Assessment/Plan:       1. Chlamydia infection  - CHLAMYDIA/GC BY PCR    Counseling STD prevention    Return if symptoms worsen or fail to improve.    Jules Husbands, DO

## 2015-04-04 NOTE — Progress Notes (Signed)
1. Have you self referred yourself since we last saw you?    Refer to care team   Or  Add specialists:    No

## 2015-04-05 ENCOUNTER — Encounter (INDEPENDENT_AMBULATORY_CARE_PROVIDER_SITE_OTHER): Payer: Self-pay | Admitting: Family Medicine

## 2015-04-13 ENCOUNTER — Ambulatory Visit (INDEPENDENT_AMBULATORY_CARE_PROVIDER_SITE_OTHER): Payer: No Typology Code available for payment source | Admitting: Family Medicine

## 2015-09-05 ENCOUNTER — Encounter (INDEPENDENT_AMBULATORY_CARE_PROVIDER_SITE_OTHER): Payer: Self-pay | Admitting: Family Medicine

## 2016-04-23 ENCOUNTER — Ambulatory Visit (INDEPENDENT_AMBULATORY_CARE_PROVIDER_SITE_OTHER): Payer: BC Managed Care – PPO | Admitting: Family Medicine

## 2016-04-23 ENCOUNTER — Encounter (INDEPENDENT_AMBULATORY_CARE_PROVIDER_SITE_OTHER): Payer: Self-pay | Admitting: Family Medicine

## 2016-04-23 VITALS — BP 125/85 | HR 69 | Temp 98.1°F | Ht 59.0 in | Wt 139.0 lb

## 2016-04-23 DIAGNOSIS — Z124 Encounter for screening for malignant neoplasm of cervix: Secondary | ICD-10-CM

## 2016-04-23 DIAGNOSIS — N6012 Diffuse cystic mastopathy of left breast: Secondary | ICD-10-CM

## 2016-04-23 DIAGNOSIS — Z0000 Encounter for general adult medical examination without abnormal findings: Secondary | ICD-10-CM

## 2016-04-23 DIAGNOSIS — N6011 Diffuse cystic mastopathy of right breast: Secondary | ICD-10-CM

## 2016-04-23 DIAGNOSIS — Z113 Encounter for screening for infections with a predominantly sexual mode of transmission: Secondary | ICD-10-CM

## 2016-04-23 DIAGNOSIS — Z872 Personal history of diseases of the skin and subcutaneous tissue: Secondary | ICD-10-CM

## 2016-04-23 LAB — CBC AND DIFFERENTIAL
Absolute NRBC: 0 10*3/uL
Basophils Absolute Automated: 0.02 10*3/uL (ref 0.00–0.20)
Basophils Automated: 0.6 %
Eosinophils Absolute Automated: 0.05 10*3/uL (ref 0.00–0.70)
Eosinophils Automated: 1.4 %
Hematocrit: 43.7 % (ref 37.0–47.0)
Hgb: 14 g/dL (ref 12.0–16.0)
Immature Granulocytes Absolute: 0 10*3/uL
Immature Granulocytes: 0 %
Lymphocytes Absolute Automated: 1.75 10*3/uL (ref 0.50–4.40)
Lymphocytes Automated: 50.7 %
MCH: 29.9 pg (ref 28.0–32.0)
MCHC: 32 g/dL (ref 32.0–36.0)
MCV: 93.4 fL (ref 80.0–100.0)
MPV: 10.7 fL (ref 9.4–12.3)
Monocytes Absolute Automated: 0.27 10*3/uL (ref 0.00–1.20)
Monocytes: 7.8 %
Neutrophils Absolute: 1.36 10*3/uL — ABNORMAL LOW (ref 1.80–8.10)
Neutrophils: 39.5 %
Nucleated RBC: 0 /100 WBC (ref 0.0–1.0)
Platelets: 310 10*3/uL (ref 140–400)
RBC: 4.68 10*6/uL (ref 4.20–5.40)
RDW: 13 % (ref 12–15)
WBC: 3.45 10*3/uL — ABNORMAL LOW (ref 3.50–10.80)

## 2016-04-23 LAB — COMPREHENSIVE METABOLIC PANEL
ALT: 17 U/L (ref 0–55)
AST (SGOT): 21 U/L (ref 5–34)
Albumin/Globulin Ratio: 1.5 (ref 0.9–2.2)
Albumin: 4.5 g/dL (ref 3.5–5.0)
Alkaline Phosphatase: 66 U/L (ref 37–106)
BUN: 16 mg/dL (ref 7.0–19.0)
Bilirubin, Total: 0.9 mg/dL (ref 0.1–1.2)
CO2: 29 mEq/L (ref 21–29)
Calcium: 9.8 mg/dL (ref 8.5–10.5)
Chloride: 104 mEq/L (ref 100–111)
Creatinine: 0.8 mg/dL (ref 0.4–1.5)
Globulin: 3.1 g/dL (ref 2.0–3.7)
Glucose: 82 mg/dL (ref 70–100)
Potassium: 4.1 mEq/L (ref 3.5–5.1)
Protein, Total: 7.6 g/dL (ref 6.0–8.3)
Sodium: 139 mEq/L (ref 136–145)

## 2016-04-23 LAB — HEMOLYSIS INDEX: Hemolysis Index: 6 (ref 0–18)

## 2016-04-23 LAB — LIPID PANEL
Cholesterol / HDL Ratio: 2.9
Cholesterol: 206 mg/dL — ABNORMAL HIGH (ref 0–199)
HDL: 70 mg/dL (ref 40–9999)
LDL Calculated: 127 mg/dL — ABNORMAL HIGH (ref 0–99)
Triglycerides: 45 mg/dL (ref 34–149)
VLDL Calculated: 9 mg/dL — ABNORMAL LOW (ref 10–40)

## 2016-04-23 LAB — HIV AG/AB 4TH GENERATION: HIV Ag/Ab, 4th Generation: NONREACTIVE

## 2016-04-23 LAB — GFR: EGFR: >60

## 2016-04-23 NOTE — Progress Notes (Signed)
Subjective:    Date: 04/23/2016 12:42 PM   Patient ID: Katherine Evans is a 31 y.o. female.    Chief Complaint   Patient presents with   . Annual Exam     fasting for labs    . Gynecologic Exam     HPI Pt presents for annual physical.    Exercise - cross fit 4 times a week.  Diet - states really good. Cutting back on carbs.  Gyn - last pap 2015 normal. No plans on pregnancy in the next year.   Had tdap - states has had in the last 10 years.   Agreeable to STD testing.   Pt c/o skin lesion of left inner thigh, just noticed few months ago, no changes, no pain. Soft to touch.    Patient Care Team:  Jules Husbands, DO as PCP - General (Family Medicine)    There is no immunization history on file for this patient.     There are no active problems to display for this patient.    No current outpatient prescriptions on file.     No current facility-administered medications for this visit.      No Known Allergies    History reviewed. No pertinent past medical history.  Past Surgical History:   Procedure Laterality Date   . COSMETIC SURGERY Bilateral 2013    ears     Family History   Problem Relation Age of Onset   . No known problems Mother    . No known problems Father    . Diabetes Paternal Grandfather    . Heart disease Paternal Grandfather    . Inflammatory bowel disease Paternal Grandfather    . Cancer Neg Hx      Social History     Social History   . Marital status: Single     Spouse name: N/A   . Number of children: N/A   . Years of education: N/A     Occupational History   . Not on file.     Social History Main Topics   . Smoking status: Never Smoker   . Smokeless tobacco: Never Used   . Alcohol use Yes      Comment: occassional wine   . Drug use: No   . Sexual activity: Yes     Partners: Male     Other Topics Concern   . Not on file     Social History Narrative   . No narrative on file     The following portions of the patient's history were reviewed and updated as appropriate: allergies, current medications,  past family history, past medical history, past social history, past surgical history and problem list.    Review of Systems   Cardiovascular:        Fibrocystic breast    Skin:        Skin lesion inner thigh   All other systems reviewed and are negative.  ROS performed (handout given - General symptoms, eyes, ears/nose/mouth & throat, breasts, lungs & heart, skin, neurological, abdomen, sleep, musculoskeletal, mood, men only, women only, period questions, other) and positives as above      Objective:   BP 125/85   Pulse 69   Temp 98.1 F (36.7 C) (Oral)   Ht 1.499 m (4\' 11" )   Wt 63 kg (139 lb)   LMP 04/08/2016   BMI 28.07 kg/m   Wt Readings from Last 3 Encounters:   04/23/16 63 kg (139 lb)  04/04/15 61.7 kg (136 lb)   02/21/15 59.9 kg (132 lb)     Physical Exam   Constitutional: She is oriented to person, place, and time. She appears well-developed and well-nourished. She is cooperative. She does not appear ill. No distress.   HENT:   Head: Normocephalic and atraumatic.   Right Ear: Hearing, tympanic membrane, external ear and ear canal normal.   Left Ear: Hearing, tympanic membrane, external ear and ear canal normal.   Mouth/Throat: Uvula is midline, oropharynx is clear and moist and mucous membranes are normal. Normal dentition.   Lips without lesion   Eyes: Conjunctivae and EOM are normal. Pupils are equal, round, and reactive to light.   Neck: Trachea normal and normal range of motion. Neck supple. Carotid bruit is not present. No edema present. No thyroid mass and no thyromegaly present.   Cardiovascular: Normal rate, regular rhythm, S1 normal, S2 normal and intact distal pulses.  Exam reveals no gallop and no friction rub.    No murmur heard.  Pulmonary/Chest: Effort normal and breath sounds normal. She has no decreased breath sounds. She has no wheezes. She has no rhonchi. She has no rales. Right breast exhibits no inverted nipple, no mass, no nipple discharge, no skin change and no tenderness.  Left breast exhibits no inverted nipple, no mass, no nipple discharge, no skin change and no tenderness. Breasts are symmetrical.        Abdominal: Soft. Normal appearance and bowel sounds are normal. She exhibits no distension and no mass. There is no hepatosplenomegaly. There is no tenderness.   Genitourinary: Uterus normal.       No breast swelling, tenderness, discharge or bleeding. Pelvic exam was performed with patient supine. There is no rash, tenderness or lesion on the right labia. There is no rash, tenderness or lesion on the left labia. Uterus is not deviated, not enlarged, not fixed and not tender. Cervix exhibits no motion tenderness, no discharge and no friability. Right adnexum displays no mass, no tenderness and no fullness. Left adnexum displays no mass, no tenderness and no fullness. No erythema in the vagina. No vaginal discharge found.   Lymphadenopathy:     She has no cervical adenopathy.     She has no axillary adenopathy.        Right axillary: No pectoral and no lateral adenopathy present.        Left axillary: No pectoral and no lateral adenopathy present.       Right: No inguinal and no supraclavicular adenopathy present.        Left: No inguinal and no supraclavicular adenopathy present.   Neurological: She is alert and oriented to person, place, and time.   Skin: Skin is warm, dry and intact. No rash noted.   No rash in visible areas   Psychiatric: She has a normal mood and affect.   Nursing note and vitals reviewed.         Assessment/Plan:       1. Annual physical exam  - CBC and differential  - Comprehensive metabolic panel  - Lipid panel  - HIV Ag/Ab 4th generation  - Chlamydia/GC BY PCR  - Pap Smear, Thin Prep w/ reflex to HR HPV  - RPR [place] (Reflex to Titer and Confirmation)    2. Screening examination for STD (sexually transmitted disease)  - CBC and differential  - Comprehensive metabolic panel  - Lipid panel  - HIV Ag/Ab 4th generation  - Chlamydia/GC BY PCR  -  Pap Smear, Thin  Prep w/ reflex to HR HPV  - RPR [place] (Reflex to Titer and Confirmation)    3. Screening for malignant neoplasm of cervix  - CBC and differential  - Comprehensive metabolic panel  - Lipid panel  - HIV Ag/Ab 4th generation  - Chlamydia/GC BY PCR  - Pap Smear, Thin Prep w/ reflex to HR HPV  - RPR [place] (Reflex to Titer and Confirmation)    4. Fibrocystic breast changes of both breasts    5. History of keloid of skin    Monitor skin lesion - return if any change or desire to have removed.     Further recommendations pending test results.   Handout given to patient on health maintenance/preventive care [facility] 2014; regular health exams, dental and eye exams, BMI and BMI categories, recommended amount of ETOH; heart healthy diet, regular exercise, maintenance of healthy weight, avoidance of tobacco and street drugs, age specific preventive care, age and disease specific vaccine recommendations, and age specific cancer screening)          Jules Husbands, DO

## 2016-04-23 NOTE — Progress Notes (Signed)
Nursing Documentation:  Limb alert status: Patient asked and denied any limb restrictions for blood pressure/blood draws.  Has the patient seen any other providers since their last visit: no  The patient is due for nothing at this time, HM is up-to-date.

## 2016-04-23 NOTE — Patient Instructions (Signed)
Preventive Care for Adults, Female  [employer] Patient Information 2014 [employer]  Last revised: December 21, 2012  A healthy lifestyle and preventive care can promote health and wellness. Preventive health guidelines for women include the following key practices.  A routine yearly physical is a good way to check with your health care provider about your health and preventive screening. It is a chance to share any concerns and updates on your health and to receive a thorough exam.  Visit your dentist for a routine exam and preventive care every 6 months. Brush your teeth twice a day and floss once a day. Good oral hygiene prevents tooth decay and gum disease.  The frequency of eye exams is based on your age, health, family medical history, use of contact lenses, and other factors. Follow your health care provider's recommendations for frequency of eye exams.  Eat a healthy diet. Foods like vegetables, fruits, whole grains, low-fat dairy products, and lean protein foods contain the nutrients you need without too many calories. Decrease your intake of foods high in solid fats, added sugars, and salt. Eat the right amount of calories for you. Get information about a proper diet from your health care provider, if necessary.  Regular physical exercise is one of the most important things you can do for your health. Most adults should get at least 150 minutes of moderate-intensity exercise (any activity that increases your heart rate and causes you to sweat) each week. In addition, most adults need muscle-strengthening exercises on 2 or more days a week.  Maintain a healthy weight. The body mass index (BMI) is a screening tool to identify possible weight problems. It provides an estimate of body fat based on height and weight. Your health care provider can find your BMI, and can help you achieve or maintain a healthy weight. For adults 20 years and older:  A BMI below 18.5 is considered underweight.  A BMI of 18.5 to  24.9 is normal.  A BMI of 25 to 29.9 is considered overweight.  A BMI of 30 and above is considered obese.  Maintain normal blood lipids and cholesterol levels by exercising and minimizing your intake of saturated fat. Eat a balanced diet with plenty of fruit and vegetables. Blood tests for lipids and cholesterol should begin at age 20 and be repeated every 5 years. If your lipid or cholesterol levels are high, you are over 50, or you are at high risk for heart disease, you may need your cholesterol levels checked more frequently. Ongoing high lipid and cholesterol levels should be treated with medicines if diet and exercise are not working.  If you smoke, find out from your health care provider how to quit. If you do not use tobacco, do notstart.  Lung cancer screening is recommended for adults aged 55-80 years who are at high risk for developing lung cancer because of a history of smoking. A yearly low-dose CT scan of the lungs is recommended for people who have at least a 30-pack-year history of smoking and are a current smoker or have quit within the past 15 years. A pack year of smoking is smoking an average of 1 pack of cigarettes a day for 1 year (for example: 1 pack a day for 30 years or 2 packs a day for 15 years). Yearly screening should continue until the smoker has stopped smoking for at least 15 years. Yearly screening should be stopped for people who develop a health problem that would prevent them from   having lung cancer treatment.  If you are pregnant, do notdrink alcohol. If you are breastfeeding, be very cautious about drinking alcohol. If you are not pregnant and choose to drink alcohol, do nothave more than 1 drink per day. One drink is considered to be 12 ounces (355 mL) of beer, 5 ounces (148 mL) of wine, or 1.5 ounces (44 mL) of liquor.  Avoid use of street drugs. Do notshare needles with anyone. Ask for help if you need support or instructions about stopping the use of drugs.  High blood  pressure causes heart disease and increases the risk of stroke. Your blood pressure should be checked at least every 1 to 2 years. Ongoing high blood pressure should be treated with medicines if weight loss and exercise do not work.  If you are 55-79 years old, ask your health care provider if you should take aspirin to prevent strokes.  Diabetes screening involves taking a blood sample to check your fasting blood sugar level. This should be done once every 3 years, after age 45, if you are within normal weight and without risk factors for diabetes. Testing should be considered at a younger age or be carried out more frequently if you are overweight and have at least 1 risk factor for diabetes.  Breast cancer screening is essential preventive care for women. You should practice "breast self-awareness." This means understanding the normal appearance and feel of your breasts and may include breast self-examination. Any changes detected, no matter how small, should be reported to a health care provider. Women in their 20s and 30s should have a clinical breast exam (CBE) by a health care provider as part of a regular health exam every 1 to 3 years. After age 40, women should have a CBE every year. Starting at age 40, women should consider having a mammogram (breast X-ray test) every year. Women who have a family history of breast cancer should talk to their health care provider about genetic screening. Women at a high risk of breast cancer should talk to their health care providers about having an MRI and a mammogram every year.  Breast cancer gene (BRCA)-related cancer risk assessment is recommended for women who have family members withBRCA-related cancers.BRCA-related cancers include breast, ovarian, tubal, and peritoneal cancers. Having family members with these cancers may be associated with an increased risk for harmful changes (mutations) in the breast cancer genesBRCA1andBRCA2. Results of the assessment will  determine the need for genetic counseling andBRCA1andBRCA2testing.  The Pap test is a screening test for cervical cancer. A Pap test can show cell changes on the cervix that might become cervical cancer if left untreated. A Pap test is a procedure in which cells are obtained and examined from the lower end of the uterus (cervix).  Women should have a Pap test starting at age 21.  Between ages 21 and 29, Pap tests should be repeated every 2 years.  Beginning at age 30, you should have a Pap test every 3 years as long as the past 3 Pap tests have been normal.  Some women have medical problems that increase the chance of getting cervical cancer. Talk to your health care provider about these problems. It is especially important to talk to your health care provider if a new problem develops soon after your last Pap test. In these cases, your health care provider may recommend more frequent screening and Pap tests.  The above recommendations are the same for women who haveor have notgotten the vaccine   for human papillomavirus (HPV).  If you had a hysterectomy for a problem that was notcancer or a condition that could lead to cancer, then you no longer need Pap tests. Even if you no longer need a Pap test, a regular exam is a good idea to make sure no other problems are starting.  If you are between ages 65 and 70 years, and you have had normal Pap tests going back 10 years, you no longer need Pap tests. Even if you no longer need a Pap test, a regular exam is a good idea to make sure no other problems are starting.  If you have had past treatment for cervical cancer or a condition that could lead to cancer, you need Pap tests and screening for cancer for at least 20 years after your treatment.  If Pap tests have been discontinued, risk factors (such as a new sexual partner) need to be reassessed to determine if screening should be resumed.  The HPV test is an additional test that may be used for cervical cancer screening.  The HPV test looks for the virus that can cause the cell changes on the cervix. The cells collected during the Pap test can be tested for HPV. The HPV test could be used to screen women aged 30 years and older, and should be used in women of any age who have unclear Pap test results. After the age of 30, women should have HPV testing at the same frequency as a Pap test.  Colorectal cancer can be detected and often prevented. Most routine colorectal cancer screening begins at the age of 50 years and continues through age 75 years. However, your health care provider may recommend screening at an earlier age if you have risk factors for colon cancer. On a yearly basis, your health care provider may provide home test kits to check for hidden blood in the stool. Use of a small camera at the end of a tube, to directly examine the colon (sigmoidoscopy or colonoscopy), can detect the earliest forms of colorectal cancer. Talk to your health care provider about this at age 50, when routine screening begins. Direct exam of the colon should be repeated every 5-10 years through age 75 years, unless early forms of pre-cancerous polyps or small growths are found.  People who are at an increased risk for hepatitis B should be screened for this virus. You are considered at high risk for hepatitis B if:  You were born in a country where hepatitis B occurs often. Talk with your health care provider about which countries are considered high risk.  Your parents were born in a high-risk country and you have not received a shot to protect against hepatitis B (hepatitis B vaccine).  You have HIV or AIDS.  You use needles to inject street drugs.  You live with, or have sex with, someone who has Hepatitis B.  You get hemodialysis treatment.  You take certain medicines for conditions like cancer, organ transplantation, and autoimmune conditions.  Hepatitis C blood testing is recommended for all people born from 1945 through 1965 and any  individual with known risks for hepatitis C.  Practice safe sex. Use condoms and avoid high-risk sexual practices to reduce the spread of sexually transmitted infections (STIs). STIs include gonorrhea, chlamydia, syphilis, trichomonas, herpes, HPV, and human immunodeficiency virus (HIV). Herpes, HIV, and HPV are viral illnesses that have no cure. They can result in disability, cancer, and death. Sexually active women aged 25 years and   younger should be checked for chlamydia. Older women with new or multiple partners should also be tested for chlamydia. Testing for other STIs is recommended if you are sexually active and at increased risk.  Osteoporosis is a disease in which the bones lose minerals and strength with aging. This can result in serious bone fractures or breaks. The risk of osteoporosis can be identified using a bone density scan. Women ages 65 years and over and women at risk for fractures or osteoporosis should discuss screening with their health care providers. Ask your health care provider whether you should take a calcium supplement or vitamin D to reduce the rate of osteoporosis.  Menopause can be associated with physical symptoms and risks. Hormone replacement therapy is available to decrease symptoms and risks. You should talk to your health care provider about whether hormone replacement therapy is right for you.  Use sunscreen. Apply sunscreen liberally and repeatedly throughout the day. You should seek shade when your shadow is shorter than you. Protect yourself by wearing long sleeves, pants, a wide-brimmed hat, and sunglasses year round, whenever you are outdoors.  Once a month, do a whole body skin exam, using a mirror to look at the skin on your back. Tell your health care provider of new moles, moles that have irregular borders, moles that are larger than a pencil eraser, or moles that have changed in shape or color.  Stay current with required vaccines (immunizations).  Influenza  vaccine. All adults should be immunized every year.  Tetanus, diphtheria, and acellular pertussis (Td, Tdap) vaccine. Pregnant women should receive 1 dose of Tdap vaccine during each pregnancy. The dose should be obtained regardless of the length of time since the last dose. Immunization is preferred during the 27th-36th week of gestation. An adult who has not previously received Tdap or who does not know her vaccine status should receive 1 dose of Tdap. This initial dose should be followed by tetanus and diphtheria toxoids (Td) booster doses every 10 years. Adults with an unknown or incomplete history of completing a 3-dose immunization series with Td-containing vaccines should begin or complete a primary immunization series including a Tdap dose. Adults should receive a Td booster every 10 years.  Varicella vaccine. An adult without evidence of immunity to varicella should receive 2 doses or a second dose if she has previously received 1 dose. Pregnant females who do not have evidence of immunity should receive the first dose after pregnancy. This first dose should be obtained before leaving the health care facility. The second dose should be obtained 4-8 weeks after the first dose.  Human papillomavirus (HPV) vaccine. Females aged 13-26 years who have not received the vaccine previously should obtain the 3-dose series. The vaccine is not recommended for use in pregnant females. However, pregnancy testing is not needed before receiving a dose. If a female is found to be pregnant after receiving a dose, no treatment is needed. In that case, the remaining doses should be delayed until after the pregnancy. Immunization is recommended for any person with an immunocompromised condition through the age of 26 years if she did not get any or all doses earlier. During the 3-dose series, the second dose should be obtained 4-8 weeks after the first dose. The third dose should be obtained 24 weeks after the first dose and 16  weeks after the second dose.  Zoster vaccine. One dose is recommended for adults aged 60 years or older unless certain conditions are present.  Measles, mumps,   and rubella (MMR) vaccine. Adults born before 1957 generally are considered immune to measles and mumps. Adults born in 1957 or later should have 1 or more doses of MMR vaccine unless there is a contraindication to the vaccine or there is laboratory evidence of immunity to each of the three diseases. A routine second dose of MMR vaccine should be obtained at least 28 days after the first dose for students attending postsecondary schools, health care workers, or international travelers. People who received inactivated measles vaccine or an unknown type of measles vaccine during 1963-1967 should receive 2 doses of MMR vaccine. People who received inactivated mumps vaccine or an unknown type of mumps vaccine before 1979 and are at high risk for mumps infection should consider immunization with 2 doses of MMR vaccine. For females of childbearing age, rubella immunity should be determined. If there is no evidence of immunity, females who are not pregnant should be vaccinated. If there is no evidence of immunity, females who are pregnant should delay immunization until after pregnancy. Unvaccinated health care workers born before 1957 who lack laboratory evidence of measles, mumps, or rubella immunity or laboratory confirmation of disease should consider measles and mumps immunization with 2 doses of MMR vaccine or rubella immunization with 1 dose of MMR vaccine.  Pneumococcal 13-valent conjugate (PCV13) vaccine. When indicated, a person who is uncertain of her immunization history and has no record of immunization should receive the PCV13 vaccine. An adult aged 19 years or older who has certain medical conditions and has not been previously immunized should receive 1 dose of PCV13 vaccine. This PCV13 should be followed with a dose of pneumococcal polysaccharide  (PPSV23) vaccine. The PPSV23 vaccine dose should be obtained at least 8 weeks after the dose of PCV13 vaccine. An adult aged 19 years or older who has certain medical conditions and previously received 1 or more doses of PPSV23 vaccine should receive 1 dose of PCV13. The PCV13 vaccine dose should be obtained 1 or more years after the last PPSV23 vaccine dose.  Pneumococcal polysaccharide (PPSV23) vaccine. When PCV13 is also indicated, PCV13 should be obtained first. All adults aged 65 years and older should be immunized. An adult younger than age 65 years who has certain medical conditions should be immunized. Any person who resides in a nursing home or long-term care facility should be immunized. An adult smoker should be immunized. People with an immunocompromised condition and certain other conditions should receive both PCV13 and PPSV23 vaccines. People with human immunodeficiency virus (HIV) infection should be immunized as soon as possible after diagnosis. Immunization during chemotherapy or radiation therapy should be avoided. Routine use of PPSV23 vaccine is not recommended for American Indians, [place] Natives, or people younger than 65 years unless there are medical conditions that require PPSV23 vaccine. When indicated, people who have unknown immunization and have no record of immunization should receive PPSV23 vaccine. One-time revaccination 5 years after the first dose of PPSV23 is recommended for people aged 19-64 years who have chronic kidney failure, nephrotic syndrome, asplenia, or immunocompromised conditions. People who received 1-2 doses of PPSV23 before age 65 years should receive another dose of PPSV23 vaccine at age 65 years or later if at least 5 years have passed since the previous dose. Doses of PPSV23 are not needed for people immunized with PPSV23 at or after age 65 years.  Meningococcal vaccine. Adults with asplenia or persistent complement component deficiencies should receive 2 doses  of quadrivalent meningococcal conjugate (MenACWY-D) vaccine. The doses   should be obtained at least 2 months apart. Microbiologists working with certain meningococcal bacteria, military recruits, people at risk during an outbreak, and people who travel to or live in countries with a high rate of meningitis should be immunized. A first-year college student up through age 21 years who is living in a residence hall should receive a dose if she did not receive a dose on or after her 16th birthday. Adults who have certain high-risk conditions should receive one or more doses of vaccine.  Hepatitis A vaccine. Adults who wish to be protected from this disease, have certain high-risk conditions, work with hepatitis A-infected animals, work in hepatitis A research labs, or travel to or work in countries with a high rate of hepatitis A should be immunized. Adults who were previously unvaccinated and who anticipate close contact with an international adoptee during the first 60 days after arrival in the [place] from a country with a high rate of hepatitis A should be immunized.  Hepatitis B vaccine. Adults who wish to be protected from this disease, have certain high-risk conditions, may be exposed to blood or other infectious body fluids, are household contacts or sex partners of hepatitis B positive people, are clients or workers in certain care facilities, or travel to or work in countries with a high rate of hepatitis B should be immunized.  Haemophilus influenzaetype b (Hib) vaccine. A previously unvaccinated person with asplenia or sickle cell disease or having a scheduled splenectomy should receive 1 dose of Hib vaccine. Regardless of previous immunization, a recipient of a hematopoietic stem cell transplant should receive a 3-dose series 6-12 months after her successful transplant. Hib vaccine is not recommended for adults with HIV infection.  Preventive Services / Frequency  Ages 19 to 39 years  Blood pressure  check.** / Every 1 to 2 years.  Lipid and cholesterol check.** / Every 5 years beginning at age 20.  Clinical breast exam.** / Every 3 years for women in their 20s and 30s.  BRCA-related cancer risk assessment.** / For women who have family members with aBRCA-related cancer (breast, ovarian, tubal, or peritoneal cancers).  Pap test.** / Every 2 years from ages 21 through 29. Every 3 years starting at age 30 through age 65 or 70 with a history of 3 consecutive normal Pap tests.  HPV screening.** / Every 3 years from ages 30 through ages 65 to 70 with a history of 3 consecutive normal Pap tests.  Hepatitis C blood test.** / For any individual with known risks for hepatitis C.  Skin self-exam. / Monthly.  Influenza vaccine. / Every year.  Tetanus, diphtheria, and acellular pertussis (Tdap, Td) vaccine.** / Consult your health care provider. Pregnant women should receive 1 dose of Tdap vaccine during each pregnancy. 1 dose of Td every 10 years.  Varicella vaccine.** / Consult your health care provider. Pregnant females who do not have evidence of immunity should receive the first dose after pregnancy.  HPV vaccine. / 3 doses over 6 months, if 26 and younger. The vaccine is not recommended for use in pregnant females. However, pregnancy testing is not needed before receiving a dose.  Measles, mumps, rubella (MMR) vaccine.** / You need at least 1 dose of MMR if you were born in 1957 or later. You may also need a 2nddose. For females of childbearing age, rubella immunity should be determined. If there is no evidence of immunity, females who are not pregnant should be vaccinated. If there is no evidence of   immunity, females who are pregnant should delay immunization until after pregnancy.  Pneumococcal 13-valent conjugate (PCV13) vaccine.** / Consult your health care provider.  Pneumococcal polysaccharide (PPSV23) vaccine.** / 1 to 2 doses if you smoke cigarettes or if you have certain conditions.  Meningococcal vaccine.**  / 1 dose if you are age 19 to 21 years and a first-year college student living in a residence hall, or have one of several medical conditions, you need to get vaccinated against meningococcal disease. You may also need additional booster doses.  Hepatitis A vaccine.** / Consult your health care provider.  Hepatitis B vaccine.** / Consult your health care provider.  Haemophilus influenzaetype b (Hib) vaccine.** / Consult your health care provider.  Ages 40 to 64 years  Blood pressure check.** / Every 1 to 2 years.  Lipid and cholesterol check.** / Every 5 years beginning at age 20 years.  Lung cancer screening. / Every year if you are aged 55-80 years and have a 30-pack-year history of smoking and currently smoke or have quit within the past 15 years. Yearly screening is stopped once you have quit smoking for at least 15 years or develop a health problem that would prevent you from having lung cancer treatment.  Clinical breast exam.** / Every year after age 40 years.  BRCA-related cancer risk assessment.** / For women who have family members with aBRCA-related cancer (breast, ovarian, tubal, or peritoneal cancers).  Mammogram.** / Every year beginning at age 40 years and continuing for as long as you are in good health. Consult with your health care provider.  Pap test.** / Every 3 years starting at age 30 years through age 65 or 70 years with a history of 3 consecutive normal Pap tests.  HPV screening.** / Every 3 years from ages 30 years through ages 65 to 70 years with a history of 3 consecutive normal Pap tests.  Fecal occult blood test (FOBT) of stool. / Every year beginning at age 50 years and continuing until age 75 years. You may not need to do this test if you get a colonoscopy every 10 years.  Flexible sigmoidoscopy or colonoscopy.** / Every 5 years for a flexible sigmoidoscopy or every 10 years for a colonoscopy beginning at age 50 years and continuing until age 75 years.  Hepatitis C blood test.** / For  all people born from 1945 through 1965 and any individual with known risks for hepatitis C.  Skin self-exam. / Monthly.  Influenza vaccine. / Every year.  Tetanus, diphtheria, and acellular pertussis (Tdap/Td) vaccine.** / Consult your health care provider. Pregnant women should receive 1 dose of Tdap vaccine during each pregnancy. 1 dose of Td every 10 years.  Varicella vaccine.** / Consult your health care provider. Pregnant females who do not have evidence of immunity should receive the first dose after pregnancy.  Zoster vaccine.** / 1 dose for adults aged 60 years or older.  Measles, mumps, rubella (MMR) vaccine.** / You need at least 1 dose of MMR if you were born in 1957 or later. You may also need a 2nddose. For females of childbearing age, rubella immunity should be determined. If there is no evidence of immunity, females who are not pregnant should be vaccinated. If there is no evidence of immunity, females who are pregnant should delay immunization until after pregnancy.  Pneumococcal 13-valent conjugate (PCV13) vaccine.** / Consult your health care provider.  Pneumococcal polysaccharide (PPSV23) vaccine.** / 1 to 2 doses if you smoke cigarettes or if you have certain   conditions.  Meningococcal vaccine.** / Consult your health care provider.  Hepatitis A vaccine.** / Consult your health care provider.  Hepatitis B vaccine.** / Consult your health care provider.  Haemophilus influenzaetype b (Hib) vaccine.** / Consult your health care provider.  Ages 65 years and over  Blood pressure check.** / Every 1 to 2 years.  Lipid and cholesterol check.** / Every 5 years beginning at age 20 years.  Lung cancer screening. / Every year if you are aged 55-80 years and have a 30-pack-year history of smoking and currently smoke or have quit within the past 15 years. Yearly screening is stopped once you have quit smoking for at least 15 years or develop a health problem that would prevent you from having lung cancer  treatment.  Clinical breast exam.** / Every year after age 40 years.  BRCA-related cancer risk assessment.** / For women who have family members with aBRCA-related cancer (breast, ovarian, tubal, or peritoneal cancers).  Mammogram.** / Every year beginning at age 40 years and continuing for as long as you are in good health. Consult with your health care provider.  Pap test.** / Every 3 years starting at age 30 years through age 65 or 70 years with 3 consecutive normal Pap tests. Testing can be stopped between 65 and 70 years with 3 consecutive normal Pap tests and no abnormal Pap or HPV tests in the past 10 years.  HPV screening.** / Every 3 years from ages 30 years through ages 65 or 70 years with a history of 3 consecutive normal Pap tests. Testing can be stopped between 65 and 70 years with 3 consecutive normal Pap tests and no abnormal Pap or HPV tests in the past 10 years.  Fecal occult blood test (FOBT) of stool. / Every year beginning at age 50 years and continuing until age 75 years. You may not need to do this test if you get a colonoscopy every 10 years.  Flexible sigmoidoscopy or colonoscopy.** / Every 5 years for a flexible sigmoidoscopy or every 10 years for a colonoscopy beginning at age 50 years and continuing until age 75 years.  Hepatitis C blood test.** / For all people born from 1945 through 1965 and any individual with known risks for hepatitis C.  Osteoporosis screening.** / A one-time screening for women ages 65 years and over and women at risk for fractures or osteoporosis.  Skin self-exam. / Monthly.  Influenza vaccine. / Every year.  Tetanus, diphtheria, and acellular pertussis (Tdap/Td) vaccine.** / 1 dose of Td every 10 years.  Varicella vaccine.** / Consult your health care provider.  Zoster vaccine.** / 1 dose for adults aged 60 years or older.  Pneumococcal 13-valent conjugate (PCV13) vaccine.** / Consult your health care provider.  Pneumococcal polysaccharide (PPSV23) vaccine.** / 1  dose for all adults aged 65 years and older.  Meningococcal vaccine.** / Consult your health care provider.  Hepatitis A vaccine.** / Consult your health care provider.  Hepatitis B vaccine.** / Consult your health care provider.  Haemophilus influenzaetype b (Hib) vaccine.** / Consult your health care provider.  ** Family history and personal history of risk and conditions may change your health care provider's recommendations.

## 2016-04-24 LAB — RPR (REFLEX TO TITER AND CONFIRMATION): RPR: NONREACTIVE

## 2016-04-30 LAB — PAP SMEAR, THIN PREP W/ REFLEX TO HR HPV

## 2016-05-06 ENCOUNTER — Other Ambulatory Visit (INDEPENDENT_AMBULATORY_CARE_PROVIDER_SITE_OTHER): Payer: Self-pay | Admitting: Family Medicine

## 2016-05-06 DIAGNOSIS — D72819 Decreased white blood cell count, unspecified: Secondary | ICD-10-CM

## 2016-09-19 ENCOUNTER — Encounter (INDEPENDENT_AMBULATORY_CARE_PROVIDER_SITE_OTHER): Payer: Self-pay | Admitting: Family Medicine

## 2017-04-24 ENCOUNTER — Ambulatory Visit (INDEPENDENT_AMBULATORY_CARE_PROVIDER_SITE_OTHER): Payer: No Typology Code available for payment source | Admitting: Family Medicine

## 2017-04-24 ENCOUNTER — Encounter (INDEPENDENT_AMBULATORY_CARE_PROVIDER_SITE_OTHER): Payer: Self-pay | Admitting: Family Medicine

## 2017-04-24 VITALS — BP 120/71 | HR 77 | Temp 97.5°F | Resp 18 | Ht 59.0 in | Wt 135.2 lb

## 2017-04-24 DIAGNOSIS — Z113 Encounter for screening for infections with a predominantly sexual mode of transmission: Secondary | ICD-10-CM

## 2017-04-24 DIAGNOSIS — Z23 Encounter for immunization: Secondary | ICD-10-CM

## 2017-04-24 DIAGNOSIS — Z0000 Encounter for general adult medical examination without abnormal findings: Secondary | ICD-10-CM

## 2017-04-24 LAB — COMPREHENSIVE METABOLIC PANEL
ALT: 22 U/L (ref 0–55)
AST (SGOT): 24 U/L (ref 5–34)
Albumin/Globulin Ratio: 1.4 (ref 0.9–2.2)
Albumin: 4.1 g/dL (ref 3.5–5.0)
Alkaline Phosphatase: 66 U/L (ref 37–106)
BUN: 13 mg/dL (ref 7.0–19.0)
Bilirubin, Total: 1.1 mg/dL (ref 0.2–1.2)
CO2: 26 mEq/L (ref 21–29)
Calcium: 10 mg/dL (ref 8.5–10.5)
Chloride: 103 mEq/L (ref 100–111)
Creatinine: 0.8 mg/dL (ref 0.4–1.5)
Globulin: 2.9 g/dL (ref 2.0–3.7)
Glucose: 83 mg/dL (ref 70–100)
Potassium: 4.3 mEq/L (ref 3.5–5.1)
Protein, Total: 7 g/dL (ref 6.0–8.3)
Sodium: 136 mEq/L (ref 136–145)

## 2017-04-24 LAB — LIPID PANEL
Cholesterol / HDL Ratio: 2.7
Cholesterol: 188 mg/dL (ref 0–199)
HDL: 69 mg/dL (ref 40–9999)
LDL Calculated: 111 mg/dL — ABNORMAL HIGH (ref 0–99)
Triglycerides: 39 mg/dL (ref 34–149)
VLDL Calculated: 8 mg/dL — ABNORMAL LOW (ref 10–40)

## 2017-04-24 LAB — CBC AND DIFFERENTIAL
Absolute NRBC: 0 10*3/uL (ref 0.00–0.00)
Basophils Absolute Automated: 0.03 10*3/uL (ref 0.00–0.08)
Basophils Automated: 0.7 %
Eosinophils Absolute Automated: 0.09 10*3/uL (ref 0.00–0.44)
Eosinophils Automated: 2.2 %
Hematocrit: 42.5 % (ref 34.7–43.7)
Hgb: 13.5 g/dL (ref 11.4–14.8)
Immature Granulocytes Absolute: 0.01 10*3/uL (ref 0.00–0.07)
Immature Granulocytes: 0.2 %
Lymphocytes Absolute Automated: 1.92 10*3/uL (ref 0.42–3.22)
Lymphocytes Automated: 46.3 %
MCH: 29.9 pg (ref 25.1–33.5)
MCHC: 31.8 g/dL (ref 31.5–35.8)
MCV: 94 fL (ref 78.0–96.0)
MPV: 11 fL (ref 8.9–12.5)
Monocytes Absolute Automated: 0.39 10*3/uL (ref 0.21–0.85)
Monocytes: 9.4 %
Neutrophils Absolute: 1.71 10*3/uL (ref 1.10–6.33)
Neutrophils: 41.2 %
Nucleated RBC: 0 /100 WBC (ref 0.0–0.0)
Platelets: 278 10*3/uL (ref 142–346)
RBC: 4.52 10*6/uL (ref 3.90–5.10)
RDW: 13 % (ref 11–15)
WBC: 4.15 10*3/uL (ref 3.10–9.50)

## 2017-04-24 LAB — GFR: EGFR: >60

## 2017-04-24 LAB — HEPATITIS B SURFACE ANTIGEN W/ REFLEX TO CONFIRMATION: Hepatitis B Surface Antigen: NONREACTIVE

## 2017-04-24 LAB — SYPHILIS SCREEN IGG AND IGM: Syphilis Screen IgG and IgM: NONREACTIVE

## 2017-04-24 LAB — HEMOLYSIS INDEX: Hemolysis Index: 6 (ref 0–18)

## 2017-04-24 NOTE — Progress Notes (Signed)
Subjective:    Date: 04/24/2017 12:40 PM   Patient ID: Katherine Evans is a 32 y.o. female.    Chief Complaint   Patient presents with   . Annual Exam     fasting, no pap      HPI    Pt presents for routine health maintenance visit/physical  Diet: balanced diet; drinks water usually. 2 -3 cups of coffee per day   Exercise: cross fit 4-5 times a week  Dental: regular dental visits twice a year  Vision: contact lenses and has had exam within the last year  Hearing: normal hearing  Gyn history: no plans on pregnancy in the next year  + sexually active, uses condoms.      Patient Active Problem List    Diagnosis Date Noted   . Fibrocystic breast changes of both breasts 04/23/2016   . History of keloid of skin 04/23/2016     Patient Care Team:  Jules Husbands, DO as PCP - General (Family Medicine)  Immunization History   Administered Date(s) Administered   . Tdap 04/24/2017        No current outpatient prescriptions on file.     No current facility-administered medications for this visit.      No Known Allergies    History reviewed. No pertinent past medical history.     Past Surgical History:   Procedure Laterality Date   . COSMETIC SURGERY Bilateral 2013    ears     Family History   Problem Relation Age of Onset   . No known problems Mother    . No known problems Father    . Diabetes Paternal Grandfather    . Heart disease Paternal Grandfather    . Inflammatory bowel disease Paternal Grandfather    . No known problems Sister    . No known problems Brother    . Cancer Neg Hx      Social History     Social History   . Marital status: Single     Spouse name: N/A   . Number of children: N/A   . Years of education: N/A     Occupational History   . Not on file.     Social History Main Topics   . Smoking status: Never Smoker   . Smokeless tobacco: Never Used   . Alcohol use Yes      Comment: occassional wine   . Drug use: No   . Sexual activity: Yes     Partners: Male     Birth control/ protection: Condom     Other Topics  Concern   . Not on file     Social History Narrative   . No narrative on file     The following portions of the patient's history were reviewed and updated as appropriate: allergies, current medications, past family history, past medical history, past social history, past surgical history and problem list.    Review of Systems   Genitourinary:        Lumpy breasts, no specific lump/mass   Skin:        keloids   All other systems reviewed and are negative.    ROS performed (handout given - General symptoms, eyes, ears/nose/mouth & throat, breasts, lungs & heart, skin, neurological, abdomen, sleep, musculoskeletal, mood, men only, women only, period questions, other) and positives as above      Objective:   BP 120/71   Pulse 77   Temp 97.5 F (36.4  C) (Oral)   Resp 18   Ht 1.499 m (4\' 11" )   Wt 61.3 kg (135 lb 3.2 oz)   LMP 04/08/2017 (Exact Date)   BMI 27.31 kg/m   Wt Readings from Last 3 Encounters:   04/24/17 61.3 kg (135 lb 3.2 oz)   04/23/16 63 kg (139 lb)   04/04/15 61.7 kg (136 lb)     Physical Exam   Constitutional: She is oriented to person, place, and time. She appears well-developed and well-nourished. She is cooperative. No distress.   HENT:   Head: Normocephalic and atraumatic.   Right Ear: Hearing, tympanic membrane, external ear and ear canal normal.   Left Ear: Hearing, tympanic membrane, external ear and ear canal normal.   Mouth/Throat: Uvula is midline, oropharynx is clear and moist and mucous membranes are normal. Normal dentition.   Lips without lesion   Eyes: Pupils are equal, round, and reactive to light. Conjunctivae and EOM are normal.   Neck: Trachea normal. Neck supple. Carotid bruit is not present. No edema present. No thyroid mass and no thyromegaly present.   Cardiovascular: Normal rate, regular rhythm, S1 normal, S2 normal and intact distal pulses.    No murmur heard.  Pulmonary/Chest: Effort normal and breath sounds normal. She has no decreased breath sounds. She has no  wheezes. She has no rhonchi. She has no rales. Right breast exhibits no inverted nipple, no mass, no nipple discharge, no skin change and no tenderness. Left breast exhibits no inverted nipple, no mass, no nipple discharge, no skin change and no tenderness. Breasts are symmetrical.   Abdominal: Soft. Normal appearance and bowel sounds are normal. She exhibits no distension and no mass. There is no hepatosplenomegaly. There is no tenderness.   Lymphadenopathy:     She has no cervical adenopathy.        Right axillary: No pectoral and no lateral adenopathy present.        Left axillary: No pectoral and no lateral adenopathy present.       Right: No supraclavicular adenopathy present.        Left: No supraclavicular adenopathy present.   Neurological: She is alert and oriented to person, place, and time.   Skin: Skin is warm, dry and intact. No rash noted.   No rash in visible areas    Few dermatofibromas on lower extremities   Psychiatric: She has a normal mood and affect.   Nursing note and vitals reviewed.         Assessment/Plan:       1. Routine general medical examination at a health care facility  - CBC and differential  - Comprehensive metabolic panel  - Lipid panel    2. Need for tetanus, diphtheria, and acellular pertussis (Tdap) vaccine  - Tdap vaccine greater than or equal to 7yo IM    3. Screening examination for STD (sexually transmitted disease)  - HIV Ag/Ab 4th generation  - Hepatitis B (HBV) Surface Antigen  - HSV 1/2 Type-Specific IgG  - Syphilis Screen IgG and IgM  - Urine Chlamydia/GC by PCR      Colon cancer screening: not indicated  Breast cancer screening: not indicated  Cervical cancer screening: Pap is up to date, Next pap recommended 04/2019  Osteoporosis screening: not indicated  Hepatitis C screening: not indicated  HIV screening - Reviewed risks and benefits of testing, the implications of HIV test results, and how test results will be communicated - patient agreeable screening and had no  questions.  She requested STD screening    Discussed the importance of regular health exams, dental and eye exams.  Counseled on nutrition, regular aerobic/strenthening exercise, and maintaining/reaching a healthy weight.  BMI reviewed. Counseled on avoiding misuse of alcohol and avoiding/cessation of tobacco and drug use.  Immunizations recommended as per AC[employer]. Discussed and ordered age/gender appropriate screening tests.   Handout given to patient on health maintenance/preventive care ([employer] 2018)       HIgh BMI Follow Up   BMI Follow Up Care Plan Documented    Encouraged to continue regular cardio and muscle conditioning exercise  Counseled on increasing exercise - Recommend at least 2.5 hours per week of moderate intensity aerobic physical activity plus muscle strengthening activities that involve all major muscle groups on 2 or more days per week and Counseled on dietary modification - making heart healthy dietary changes, meal planning, portion control      Return in about 1 year (around 04/25/2018) for annual physical.    Jules Husbands, DO

## 2017-04-24 NOTE — Patient Instructions (Signed)
Health Maintenance, Female  Elsevier Interactive Patient Education  2017 [employer]  Last revised: February 08, 2016.  Introduction  Adopting a healthy lifestyle and getting preventive care can go a long way to promote health and wellness. Talk with your health care provider about what schedule of regular examinations is right for you. This is a good chance for you to check in with your provider about disease prevention and staying healthy.  In between checkups, there are plenty of things you can do on your own. Experts have done a lot of research about which lifestyle changes and preventive measures are most likely to keep you healthy. Ask your health care provider for more information.  Weight and diet  Eat a healthy diet  Be sure to include plenty of vegetables, fruits, low-fat dairy products, and lean protein.   Do not eat a lot of foods high in solid fats, added sugars, or salt.   Get regular exercise. This is one of the most important things you can do for your health.   Most adults should exercise for at least 150 minutes each week. The exercise should increase your heart rate and make you sweat (moderate-intensity exercise).   Most adults should also do strengthening exercises at least twice a week. This is in addition to the moderate-intensity exercise.  Maintain a healthy weight  Body mass index (BMI) is a measurement that can be used to identify possible weight problems. It estimates body fat based on height and weight. Your health care provider can help determine your BMI and help you achieve or maintain a healthy weight.   For females 20 years of age and older:   A BMI below 18.5 is considered underweight.   A BMI of 18.5 to 24.9 is normal.   A BMI of 25 to 29.9 is considered overweight.   A BMI of 30 and above is considered obese.  Watch levels of cholesterol and blood lipids  You should start having your blood tested for lipids and cholesterol at 31 years of age, then have this test every 5  years.   You may need to have your cholesterol levels checked more often if:   Your lipid or cholesterol levels are high.   You are older than 31 years of age.   You are at high risk for heart disease.  Cancer screening  Lung Cancer  Lung cancer screening is recommended for adults 55-80 years old who are at high risk for lung cancer because of a history of smoking.   A yearly low-dose CT scan of the lungs is recommended for people who:   Currently smoke.   Have quit within the past 15 years.   Have at least a 30-pack-year history of smoking. A pack year is smoking an average of one pack of cigarettes a day for 1 year.  Yearly screening should continue until it has been 15 years since you quit.   Yearly screening should stop if you develop a health problem that would prevent you from having lung cancer treatment.  Breast Cancer  Practice breast self-awareness. This means understanding how your breasts normally appear and feel.   It also means doing regular breast self-exams. Let your health care provider know about any changes, no matter how small.   If you are in your 20s or 30s, you should have a clinical breast exam (CBE) by a health care provider every 1-3 years as part of a regular health exam.   If you are   40 or older, have a CBE every year. Also consider having a breast X-ray (mammogram) every year.   If you have a family history of breast cancer, talk to your health care provider about genetic screening.   If you are at high risk for breast cancer, talk to your health care provider about having an MRI and a mammogram every year.   Breast cancer gene (BRCA) assessment is recommended for women who have family members with BRCA-related cancers. BRCA-related cancers include:   Breast.   Ovarian.   Tubal.   Peritoneal cancers.  Results of the assessment will determine the need for genetic counseling andBRCA1andBRCA2testing.  Cervical Cancer  Your health care provider may recommend that you be screened regularly  for cancer of the pelvic organs (ovaries, uterus, and vagina). This screening involves a pelvic examination, including checking for microscopic changes to the surface of your cervix (Pap test). You may be encouraged to have this screening done every 3 years, beginning at age 21.  For women ages 30-65, health care providers may recommend pelvic exams and Pap testing every 3 years, or they may recommend the Pap and pelvic exam, combined with testing for human papilloma virus (HPV), every 5 years. Some types of HPV increase your risk of cervical cancer. Testing for HPV may also be done on women of any age with unclear Pap test results.   Other health care providers may not recommend any screening for nonpregnant women who are considered low risk for pelvic cancer and who do not have symptoms. Ask your health care provider if a screening pelvic exam is right for you.   If you have had past treatment for cervical cancer or a condition that could lead to cancer, you need Pap tests and screening for cancer for at least 20 years after your treatment. If Pap tests have been discontinued, your risk factors (such as having a new sexual partner) need to be reassessed to determine if screening should resume. Some women have medical problems that increase the chance of getting cervical cancer. In these cases, your health care provider may recommend more frequent screening and Pap tests.  Colorectal Cancer  This type of cancer can be detected and often prevented.   Routine colorectal cancer screening usually begins at 31 years of age and continues through 31 years of age.   Your health care provider may recommend screening at an earlier age if you have risk factors for colon cancer.   Your health care provider may also recommend using home test kits to check for hidden blood in the stool.   A small camera at the end of a tube can be used to examine your colon directly (sigmoidoscopyorcolonoscopy). This is done to check for the  earliest forms of colorectal cancer.   Routine screening usually begins at age 50.   Direct examination of the colon should be repeated every 5-10 years through 31 years of age. However, you may need to be screened more often if early forms of precancerous polyps or small growths are found.  Skin Cancer  Check your skin from head to toe regularly.   Tell your health care provider about any new moles or changes in moles, especially if there is a change in a mole's shape or color.   Also tell your health care provider if you have a mole that is larger than the size of a pencil eraser.   Always use sunscreen. Apply sunscreen liberally and repeatedly throughout the day.     Protect yourself by wearing long sleeves, pants, a wide-brimmed hat, and sunglasses whenever you are outside.  Heart disease, diabetes, and high blood pressure  High blood pressure causes heart disease and increases the risk of stroke. High blood pressure is more likely to develop in:   People who have blood pressure in the high end of the normal range (130-139/85-89 mm Hg).   People who are overweight or obese.   People who are African American.  If you are 18-39 years of age, have your blood pressure checked every 3-5 years. If you are 40 years of age or older, have your blood pressure checked every year. You should have your blood pressure measured twice-once when you are at a hospital or clinic, and once when you are not at a hospital or clinic. Record the average of the two measurements. To check your blood pressure when you are not at a hospital or clinic, you can use:   An automated blood pressure machine at a pharmacy.   A home blood pressure monitor.  If you are between 55 years and 79 years old, ask your health care provider if you should take aspirin to prevent strokes.   Have regular diabetes screenings. This involves taking a blood sample to check your fasting blood sugar level.   If you are at a normal weight and have a low risk for  diabetes, have this test once every three years after 31 years of age.   If you are overweight and have a high risk for diabetes, consider being tested at a younger age or more often.  Preventing infection  Hepatitis B  If you have a higher risk for hepatitis B, you should be screened for this virus. You are considered at high risk for hepatitis B if:   You were born in a country where hepatitis B is common. Ask your health care provider which countries are considered high risk.   Your parents were born in a high-risk country, and you have not been immunized against hepatitis B (hepatitis B vaccine).   You have HIV or AIDS.   You use needles to inject street drugs.   You live with someone who has hepatitis B.   You have had sex with someone who has hepatitis B.   You get hemodialysis treatment.   You take certain medicines for conditions, including cancer, organ transplantation, and autoimmune conditions.  Hepatitis C  Blood testing is recommended for:   Everyone born from 1945 through 1965.   Anyone with known risk factors for hepatitis C.  Sexually transmitted infections (STIs)  You should be screened for sexually transmitted infections (STIs) including gonorrhea and chlamydia if:   You are sexually active and are younger than 31 years of age.   You are older than 31 years of age and your health care provider tells you that you are at risk for this type of infection.   Your sexual activity has changed since you were last screened and you are at an increased risk for chlamydia or gonorrhea. Ask your health care provider if you are at risk.  If you do not have HIV, but are at risk, it may be recommended that you take a prescription medicine daily to prevent HIV infection. This is called pre-exposure prophylaxis (PrEP). You are considered at risk if:   You are sexually active and do not regularly use condoms or know the HIV status of your partner(s).   You take drugs by injection.     You are sexually active with a  partner who has HIV.  Talk with your health care provider about whether you are at high risk of being infected with HIV. If you choose to begin PrEP, you should first be tested for HIV. You should then be tested every 3 months for as long as you are taking PrEP.  Pregnancy  If you are premenopausal and you may become pregnant, ask your health care provider about preconception counseling.   If you may become pregnant, take 400 to 800 micrograms (mcg) of folic acid every day.   If you want to prevent pregnancy, talk to your health care provider about birth control (contraception).  Osteoporosis and menopause  Osteoporosis is a disease in which the bones lose minerals and strength with aging. This can result in serious bone fractures. Your risk for osteoporosis can be identified using a bone density scan.   If you are 65 years of age or older, or if you are at risk for osteoporosis and fractures, ask your health care provider if you should be screened.   Ask your health care provider whether you should take a calcium or vitamin D supplement to lower your risk for osteoporosis.   Menopause may have certain physical symptoms and risks.   Hormone replacement therapy may reduce some of these symptoms and risks.  Talk to your health care provider about whether hormone replacement therapy is right for you.  Follow these instructions at home:  Schedule regular health, dental, and eye exams.   Stay current with your immunizations.   Do not use any tobacco products including cigarettes, chewing tobacco, or electronic cigarettes.   If you are pregnant, do not drink alcohol.   If you are breastfeeding, limit how much and how often you drink alcohol.   Limit alcohol intake to no more than 1 drink per day for nonpregnant women. One drink equals 12 ounces of beer, 5 ounces of wine, or 1 ounces of hard liquor.   Do not use street drugs.   Do not share needles.   Ask your health care provider for help if you need support or  information about quitting drugs.   Tell your health care provider if you often feel depressed.   Tell your health care provider if you have ever been abused or do not feel safe at home.

## 2017-04-24 NOTE — Progress Notes (Signed)
Have you seen any specialists/other providers since your last visit with us?    No    Arm preference verified?   No    The patient is due for depression screening

## 2017-04-25 LAB — URINE CHLAMYDIA/NEISSERIA BY PCR
Chlamydia DNA by PCR: NEGATIVE
Neisseria gonorrhoeae by PCR: NEGATIVE

## 2017-04-25 LAB — HIV AG/AB 4TH GENERATION: HIV Ag/Ab, 4th Generation: NONREACTIVE

## 2017-04-26 LAB — HSV TYPE 1 AND 2 ANTIBODY IGG: HSV 2 IgG Type-Specific Antibody: <0.9 (ref <0.90)

## 2017-04-26 LAB — HSV TYPE 1 AND 2 IGG: HSV 1 IgG Type-Specific AB: <0.9 (ref <0.90)

## 2018-07-01 ENCOUNTER — Encounter (INDEPENDENT_AMBULATORY_CARE_PROVIDER_SITE_OTHER): Payer: No Typology Code available for payment source | Admitting: Family Medicine

## 2018-07-08 ENCOUNTER — Encounter (INDEPENDENT_AMBULATORY_CARE_PROVIDER_SITE_OTHER): Payer: No Typology Code available for payment source | Admitting: Family Medicine

## 2018-08-04 ENCOUNTER — Encounter (INDEPENDENT_AMBULATORY_CARE_PROVIDER_SITE_OTHER): Payer: No Typology Code available for payment source | Admitting: Family Medicine

## 2018-09-08 ENCOUNTER — Ambulatory Visit (INDEPENDENT_AMBULATORY_CARE_PROVIDER_SITE_OTHER): Payer: No Typology Code available for payment source | Admitting: Family Medicine

## 2018-09-08 ENCOUNTER — Encounter (INDEPENDENT_AMBULATORY_CARE_PROVIDER_SITE_OTHER): Payer: Self-pay | Admitting: Family Medicine

## 2018-09-08 VITALS — BP 120/83 | HR 85 | Temp 98.0°F | Ht 59.0 in | Wt 140.4 lb

## 2018-09-08 DIAGNOSIS — Z0000 Encounter for general adult medical examination without abnormal findings: Secondary | ICD-10-CM

## 2018-09-08 DIAGNOSIS — L918 Other hypertrophic disorders of the skin: Secondary | ICD-10-CM

## 2018-09-08 DIAGNOSIS — Z113 Encounter for screening for infections with a predominantly sexual mode of transmission: Secondary | ICD-10-CM

## 2018-09-08 DIAGNOSIS — Z1159 Encounter for screening for other viral diseases: Secondary | ICD-10-CM

## 2018-09-08 DIAGNOSIS — Z23 Encounter for immunization: Secondary | ICD-10-CM

## 2018-09-08 LAB — HEPATITIS C ANTIBODY: Hepatitis C, AB: NONREACTIVE

## 2018-09-08 LAB — TSH: TSH: 1.76 u[IU]/mL (ref 0.35–4.94)

## 2018-09-08 LAB — HEPATITIS B SURFACE ANTIGEN W/ REFLEX TO CONFIRMATION: Hepatitis B Surface Antigen: NONREACTIVE

## 2018-09-08 LAB — HIV AG/AB 4TH GENERATION: HIV Ag/Ab, 4th Generation: NONREACTIVE

## 2018-09-08 NOTE — Progress Notes (Signed)
Subjective:    Date: 09/08/2018 9:07 AM   Patient ID: Katherine Evans is a 33 y.o. female.    Chief Complaint   Patient presents with    Annual Exam     fasting    Keloid     HPI    Pt presents for routine health maintenance visit/physical    Follow-up: states keloid has grown left inner thigh.      When walking feels her legs rubbing together constantly, feels that is the cause of "keloid" and get irritated.    Left upper inner thigh.   Appeared 1 year ago.   Has had other keloids treated in the past.     No plans on pregnancy in the near future.     Diet: moderate compliance with low fat/cholesterol diet and well-balanced diet  Exercise: no longer at [employer], works for [employer] and gym will be free so will start going there when opens up.   Dental: regular dental visits twice a year  Vision: contact lenses and eye exam < 1 year ago  Hearing: normal hearing      Patient Active Problem List    Diagnosis Date Noted    Fibrocystic breast changes of both breasts 04/23/2016    History of keloid of skin 04/23/2016     Patient Care Team:  Jules Husbands, DO as PCP - General (Family Medicine)     Immunization History   Administered Date(s) Administered    Tdap 04/24/2017     No current outpatient medications on file.     No current facility-administered medications for this visit.      No Known Allergies    History reviewed. No pertinent past medical history.     Past Surgical History:   Procedure Laterality Date    KELOID EXCISION Bilateral 2013    ears     Family History   Problem Relation Age of Onset    No known problems Mother     No known problems Father     Diabetes Paternal Grandfather     Heart disease Paternal Grandfather     Inflammatory bowel disease Paternal Grandfather     No known problems Sister     No known problems Brother     Cancer Neg Hx      Social History     Socioeconomic History    Marital status: Single     Spouse name: Not on file    Number of children: Not on file    Years  of education: Not on file    Highest education level: Not on file   Occupational History    Occupation: resident Merchandiser, retail   Social Needs    Financial resource strain: Not on file    Food insecurity     Worry: Not on file     Inability: Not on file    Transportation needs     Medical: Not on file     Non-medical: Not on file   Tobacco Use    Smoking status: Never Smoker    Smokeless tobacco: Never Used   Substance and Sexual Activity    Alcohol use: Yes     Alcohol/week: 2.0 standard drinks     Types: 2 Glasses of wine per week     Comment: 2/week    Drug use: No    Sexual activity: Yes     Partners: Male     Birth control/protection: Condom   Lifestyle  Physical activity     Days per week: 3 days     Minutes per session: 60 min    Stress: Only a little   Relationships    Adult nurse on phone: Not on file     Gets together: Not on file     Attends religious service: Not on file     Active member of club or organization: Not on file     Attends meetings of clubs or organizations: Not on file     Relationship status: Not on file    Intimate partner violence     Fear of current or ex partner: Not on file     Emotionally abused: Not on file     Physically abused: Not on file     Forced sexual activity: Not on file   Other Topics Concern    Not on file   Social History Narrative    Not on file     The following portions of the patient's history were reviewed and updated as appropriate: allergies, current medications, past family history, past medical history, past social history, past surgical history and problem list.    Review of Systems   Constitutional: Negative for chills, fatigue and fever.   HENT: Negative for congestion and sore throat.    Eyes: Negative for pain and visual disturbance.   Respiratory: Negative for cough and shortness of breath.    Cardiovascular: Negative for chest pain.   Gastrointestinal: Negative for abdominal pain.   Endocrine: Negative for cold intolerance  and heat intolerance.   Genitourinary: Negative for difficulty urinating and dysuria.   Musculoskeletal: Negative for arthralgias and back pain.   Skin: Negative for rash.        keloid   Neurological: Negative for light-headedness and headaches.   Hematological: Does not bruise/bleed easily.   Psychiatric/Behavioral: Negative for dysphoric mood. The patient is not nervous/anxious.    All other systems reviewed and are negative.    ROS performed (handout given - General symptoms, eyes, ears/nose/mouth & throat, breasts, lungs & heart, skin, neurological, abdomen, sleep, musculoskeletal, mood, men only, women only, period questions, other) and positives as above      Objective:   BP 120/83 (BP Site: Left arm)    Pulse 85    Temp 98 F (36.7 C) (Oral)    Ht 1.499 m (4\' 11" )    Wt 63.7 kg (140 lb 6.4 oz)    LMP 08/31/2018    BMI 28.36 kg/m   Wt Readings from Last 3 Encounters:   09/08/18 63.7 kg (140 lb 6.4 oz)   04/24/17 61.3 kg (135 lb 3.2 oz)   04/23/16 63 kg (139 lb)     Physical Exam  Vitals signs reviewed.   Constitutional:       General: She is not in acute distress.     Appearance: Normal appearance. She is well-developed.   HENT:      Head: Normocephalic and atraumatic.      Right Ear: Hearing, tympanic membrane, ear canal and external ear normal.      Left Ear: Hearing, tympanic membrane, ear canal and external ear normal.   Eyes:      Conjunctiva/sclera: Conjunctivae normal.      Pupils: Pupils are equal, round, and reactive to light.   Neck:      Musculoskeletal: Neck supple. No edema.      Thyroid: No thyroid mass or thyromegaly.  Vascular: No carotid bruit.      Trachea: Trachea normal.   Cardiovascular:      Rate and Rhythm: Normal rate and regular rhythm.      Heart sounds: S1 normal and S2 normal. No murmur.   Pulmonary:      Effort: Pulmonary effort is normal.      Breath sounds: Normal breath sounds. No decreased breath sounds, wheezing, rhonchi or rales.   Abdominal:      General: Bowel sounds  are normal. There is no distension.      Palpations: Abdomen is soft. There is no mass.      Tenderness: There is no abdominal tenderness.   Lymphadenopathy:      Cervical: No cervical adenopathy.      Upper Body:      Right upper body: No supraclavicular adenopathy.      Left upper body: No supraclavicular adenopathy.   Skin:     General: Skin is warm and dry.      Findings: No rash.             Comments: 0.5 cm skin tag left inner thigh   Psychiatric:         Behavior: Behavior is cooperative.            Assessment/Plan:       1. Routine general medical examination at a health care facility  - CBC without differential  - Basic Metabolic Panel  - Lipid panel  - TSH    2. Need for prophylactic vaccination and inoculation against influenza  - Flu Vaccine Quadrivalent 3 years & up PRESERVATIVE FREE (Fluzone)    3. Screening examination for STD (sexually transmitted disease)  - Hepatitis B (HBV) Surface Antigen  - HIV Ag/Ab 4th generation  - Syphilis Screen IgG and IgM    4. Need for hepatitis C screening test  - Hepatitis C (HCV) antibody, Total    5. Cutaneous skin tags  - Ambulatory referral to Dermatology      Colon cancer screening: not indicated   Breast cancer screening: not indicated   Cervical cancer screening: last pap 04/23/2016, next pap recommended 04/2019  Osteoporosis screening: not indicated  Hepatitis C screening: ordered  HIV screening - Reviewed risks and benefits of testing, the implications of HIV test results, and how test results will be communicated - patient agreeable to screening and had no questions.    Discussed the importance of regular health exams, dental and eye exams.  Counseled on nutrition, regular aerobic/strenthening exercise, and maintaining/reaching a healthy weight.  BMI reviewed. Counseled on avoiding misuse of alcohol and avoiding/cessation of tobacco and drug use.  Immunizations recommended as per ACIP. Discussed and ordered age/gender appropriate screening tests.   Handout  given to patient on health maintenance/preventive care      HIgh BMI Follow Up   BMI Follow Up Care Plan Documented   Encouragement to Exercise   Counseled on increasing exercise - Recommend at least 2.5 hours per week of moderate intensity aerobic physical activity plus muscle strengthening activities that involve all major muscle groups on 2 or more days per week and Counseled on dietary modification - making heart healthy dietary changes, meal planning, portion control      Return in about 1 year (around 09/08/2019) for annual physical.    Jules Husbands, DO

## 2018-09-08 NOTE — Progress Notes (Signed)
Have you seen any specialists/other providers since your last visit with us?    No    Arm preference verified?   Yes    The patient is due for  multiple

## 2018-09-08 NOTE — Patient Instructions (Signed)
Prevention Guidelines, Women Ages 18 to 39  Screening tests and vaccines are an important part of managing your health. A screening test is done to find possible disorders or diseases in people who don't have any symptoms. The goal is to find a disease early so lifestyle changes can be made and you can be watched more closely to reduce the risk of disease, or to detect it early enough to treat it most effectively. Screening tests are not considered diagnostic, but are used to determine if more testing is needed. Health counseling is essential, too. Below are guidelines for these, for women ages 18 to 39. Talk with your healthcare provider to make sure you’re up-to-date on what you need.   Screening Who needs it How often   Alcohol misuse All women in this age group  At routine exams   Blood pressure All women in this age group  Yearly checkup if your blood pressure is normal   Normal blood pressure is less than 120/80 mm Hg   If your blood pressure reading is higher than normal, follow the advice of your healthcare provider    Breast cancer All women in this age group should talk with their healthcare providers about the need for clinical breast exams (CBE)1  Clinical breast exam every 3 years1    Cervical cancer Women ages 21 and older  Women between ages 21 and 29 should have a Pap test every 3 years; women between ages 30 and 65 are advised to have a Pap test plus an HPV test every 5 years    Chlamydia Sexually active women ages 24 and younger, and women at increased risk for infection  Every 3 years if you're at risk or have symptoms    Depression All women in this age group  At routine exams   Diabetes mellitus, type 2  Adults with no symptoms who are overweight or obese and have 1 or more other risk factors for diabetes  At least every 3 years. Also, testing for diabetes during pregnancy after the 24th week.     Gonorrhea Sexually active women at increased risk for infection  At routine exams   Hepatitis C  Anyone at increased risk  At routine exams   HIV All women At routine exams3     Obesity All women in this age group  At routine exams   Syphilis Women at increased risk for infection should talk with their healthcare provider  At routine exams   Tuberculosis Women at increased risk for infection should talk with their healthcare provider  Ask your healthcare provider    Vision All women in this age group  At least 1 complete exam in your 20s, and 2 in your 30s    Vaccine Who needs it How often   Chickenpox (varicella)  All women in this age group who have no record of this infection or vaccine  2 doses; the second dose should be given 4 to 8 weeks after the first dose    Hepatitis A Women at increased risk for infection should talk with their healthcare provider  2 doses given at least 6 months apart    Hepatitis B Women at increased risk for infection should talk with their healthcare provider  3 doses over 6 months; second dose should be given 1 month after the first dose; the third dose should be given at least 2 months after the second dose and at least 4 months after the first dose    Haemophilus   influenzae  Type B (HIB)  Women at increased risk for infection should talk with their healthcare provider  1 to 3 doses   Human papillomavirus (HPV)  All women in this age group up to age 26  3 doses; the second dose should be given 1 to 2 months after the first dose and the third dose given 6 months after the first dose    Influenza (flu) All women in this age group  Once a year   Measles, mumps, rubella (MMR)  All women in this age group who have no record of these infections or vaccines  1 or 2 doses   Meningococcal Women at increased risk for infection should talk with their healthcare provider  1 or more doses   Pneumococcal conjugate vaccine (PCV13) and pneumococcal polysaccharide vaccine (PPSV23)  Women at increased risk for infection should talk with their healthcare provider  PCV13: 1 dose ages 19 to 65  (protects against 13 types of pneumococcal bacteria)   PPSV23: 1 to 2 doses through age 64, or 1 dose at 65 or older (protects against 23 types of pneumococcal bacteria)    Tetanus/diphtheria/pertussis (Td/Tdap) booster All women in this age group  Td every 10 years, or a one-time dose of Tdap instead of a Td booster after age 18, then Td every 10 years    Counseling Who needs it How often   BRCA gene mutation testing for breast and ovarian cancer susceptibility  Women with increased risk for having gene mutation  When your risk is known    Breast cancer and chemoprevention  Women at high risk for breast cancer  When your risk is known    Diet and exercise Women who are overweight or obese  When diagnosed, and then at routine exams    Domestic violence Women at the age in which they are able to have children  At routine exams   Sexually transmitted infection prevention  Women who are sexually active  At routine exams   Skin cancer Prevention of skin cancer in fair-skinned adults  At routine exams   Use of tobacco and the health effects it can cause  All women in this age group  Every visit   1 According to the ACS, women ages 20 to 39 years should have a clinical breast exam (CBE) as part of their routine health exam every 3 years. Breast self-exams are an option for women starting in their 20s. But the U.S. Preventive Services Task Force (USPSTF) does not recommend CBE.   2 Those who are 32 years old and not up-to-date on their childhood vaccines should get all appropriate catch-up vaccines recommended by the CDC.   3 The USPSTF recommends that all people ages 15 to 65 years be screened for HIV and those younger or older people at increased risk. The CDC recommends that everyone between the ages of 13 and 64 get tested for HIV at least once as part of routine health care.   [employer] last reviewed this educational content on 10/08/2015  © 2000-2020 [employer] [address], [place] [address].  All rights reserved. This information is not intended as a substitute for professional medical care. Always follow your healthcare professional's instructions.

## 2018-09-09 LAB — SYPHILIS SCREEN IGG AND IGM: Syphilis Screen IgG and IgM: NONREACTIVE

## 2018-09-09 LAB — BASIC METABOLIC PANEL
Anion Gap: 14 (ref 5.0–15.0)
BUN: 19 mg/dL (ref 7.0–19.0)
CO2: 21 mEq/L (ref 21–29)
Calcium: 9.6 mg/dL (ref 8.5–10.5)
Chloride: 106 mEq/L (ref 100–111)
Creatinine: 0.8 mg/dL (ref 0.4–1.5)
Glucose: 89 mg/dL (ref 70–100)
Potassium: 4.2 mEq/L (ref 3.5–5.1)
Sodium: 141 mEq/L (ref 136–145)

## 2018-09-09 LAB — CBC
Absolute NRBC: 0 10*3/uL (ref 0.00–0.00)
Hematocrit: 42.6 % (ref 34.7–43.7)
Hgb: 13.2 g/dL (ref 11.4–14.8)
MCH: 29.3 pg (ref 25.1–33.5)
MCHC: 31 g/dL — ABNORMAL LOW (ref 31.5–35.8)
MCV: 94.7 fL (ref 78.0–96.0)
MPV: 10.4 fL (ref 8.9–12.5)
Nucleated RBC: 0 /100 WBC (ref 0.0–0.0)
Platelets: 297 10*3/uL (ref 142–346)
RBC: 4.5 10*6/uL (ref 3.90–5.10)
RDW: 14 % (ref 11–15)
WBC: 4.19 10*3/uL (ref 3.10–9.50)

## 2018-09-09 LAB — URINE CHLAMYDIA/NEISSERIA BY PCR
Chlamydia DNA by PCR: NEGATIVE
Neisseria gonorrhoeae by PCR: NEGATIVE

## 2018-09-09 LAB — GFR: EGFR: >60

## 2018-09-09 LAB — LIPID PANEL
Cholesterol / HDL Ratio: 2.6
Cholesterol: 181 mg/dL (ref 0–199)
HDL: 70 mg/dL (ref 40–9999)
LDL Calculated: 104 mg/dL — ABNORMAL HIGH (ref 0–99)
Triglycerides: 34 mg/dL (ref 34–149)
VLDL Calculated: 7 mg/dL — ABNORMAL LOW (ref 10–40)

## 2018-09-09 LAB — HEMOLYSIS INDEX: Hemolysis Index: 9 (ref 0–18)

## 2018-12-01 ENCOUNTER — Encounter (INDEPENDENT_AMBULATORY_CARE_PROVIDER_SITE_OTHER): Payer: Self-pay

## 2018-12-15 ENCOUNTER — Encounter (INDEPENDENT_AMBULATORY_CARE_PROVIDER_SITE_OTHER): Payer: Self-pay

## 2019-01-15 ENCOUNTER — Encounter (INDEPENDENT_AMBULATORY_CARE_PROVIDER_SITE_OTHER): Payer: Self-pay

## 2019-02-15 ENCOUNTER — Encounter (INDEPENDENT_AMBULATORY_CARE_PROVIDER_SITE_OTHER): Payer: Self-pay

## 2019-03-15 ENCOUNTER — Encounter (INDEPENDENT_AMBULATORY_CARE_PROVIDER_SITE_OTHER): Payer: Self-pay

## 2019-04-15 ENCOUNTER — Encounter (INDEPENDENT_AMBULATORY_CARE_PROVIDER_SITE_OTHER): Payer: Self-pay

## 2019-05-15 ENCOUNTER — Encounter (INDEPENDENT_AMBULATORY_CARE_PROVIDER_SITE_OTHER): Payer: Self-pay

## 2019-06-15 ENCOUNTER — Encounter (INDEPENDENT_AMBULATORY_CARE_PROVIDER_SITE_OTHER): Payer: Self-pay

## 2019-07-15 ENCOUNTER — Encounter (INDEPENDENT_AMBULATORY_CARE_PROVIDER_SITE_OTHER): Payer: Self-pay

## 2019-08-15 ENCOUNTER — Encounter (INDEPENDENT_AMBULATORY_CARE_PROVIDER_SITE_OTHER): Payer: Self-pay

## 2019-09-13 NOTE — Progress Notes (Signed)
Subjective:    Date: 09/14/2019 8:09 AM   Patient ID: Katherine Evans is a 34 y.o. female.    Chief Complaint   Patient presents with    Annual Exam     fasting     HPI  Pt presents for routine health maintenance visit/physical    Diet: balanced diet  Exercise: 3 days a week, weight lifting, states not enough cardio - at home  Dental: up to date every 6 months  Vision: overdue; wears contacts. No notable change in her vision.      Patient Active Problem List    Diagnosis Date Noted    Fibrocystic breast changes of both breasts 04/23/2016    History of keloid of skin 04/23/2016     Patient Care Team:  Jules Husbands, DO as PCP - General (Family Medicine)     Immunization History   Administered Date(s) Administered    COVID-19 mRNA Vaccine Preservative Free 0.5 mL (MODERNA) 01/21/2019, 02/19/2019    Influenza quadrivalent (IM) PF 3 Yrs & greater 09/08/2018    Tdap 04/24/2017     No current outpatient medications on file.     No current facility-administered medications for this visit.     No Known Allergies    History reviewed. No pertinent past medical history.  Past Surgical History:   Procedure Laterality Date    KELOID EXCISION Bilateral 2013    ears     Family History   Problem Relation Age of Onset    No known problems Mother     No known problems Father     Diabetes Paternal Grandfather     Heart disease Paternal Grandfather     Inflammatory bowel disease Paternal Grandfather     No known problems Sister     No known problems Brother     Cancer Neg Hx      Social History     Socioeconomic History    Marital status: Single     Spouse name: Not on file    Number of children: Not on file    Years of education: Not on file    Highest education level: Not on file   Occupational History    Occupation: resident supervisor   Tobacco Use    Smoking status: Never Smoker    Smokeless tobacco: Never Used   Haematologist Use: Never used   Substance and Sexual Activity    Alcohol use: Yes      Alcohol/week: 2.0 standard drinks     Types: 2 Glasses of wine per week     Comment: 2/week    Drug use: No    Sexual activity: Yes     Partners: Male     Birth control/protection: Condom   Other Topics Concern    Not on file   Social History Narrative    Not on file     Social Determinants of Health     Financial Resource Strain:     Difficulty of Paying Living Expenses:    Food Insecurity:     Worried About Programme researcher, broadcasting/film/video in the Last Year:     Barista in the Last Year:    Transportation Needs:     Freight forwarder (Medical):     Lack of Transportation (Non-Medical):    Physical Activity: Sufficiently Active    Days of Exercise per Week: 3 days    Minutes of Exercise per Session: 60  min   Stress: No Stress Concern Present    Feeling of Stress : Only a little   Social Connections:     Frequency of Communication with Friends and Family:     Frequency of Social Gatherings with Friends and Family:     Attends Religious Services:     Active Member of Clubs or Organizations:     Attends Banker Meetings:     Marital Status:    Intimate Partner Violence:     Fear of Current or Ex-Partner:     Emotionally Abused:     Physically Abused:     Sexually Abused:      The following portions of the patient's history were reviewed and updated as appropriate: allergies, current medications, past family history, past medical history, past social history, past surgical history and problem list.    Review of Systems   Constitutional: Negative for chills, fatigue and fever.   HENT: Negative for congestion and sore throat.    Eyes: Negative for pain and visual disturbance.   Respiratory: Negative for cough and shortness of breath.    Cardiovascular: Negative for chest pain.   Gastrointestinal: Negative for abdominal pain.   Endocrine: Negative for cold intolerance and heat intolerance.   Genitourinary: Negative for difficulty urinating, dysuria and menstrual problem.    Musculoskeletal: Negative for arthralgias and back pain.   Skin: Positive for color change. Negative for rash.        Color change on forehead and keloids, seeing dermatology today   Allergic/Immunologic: Negative for environmental allergies and food allergies.        States had adverse skin reaction to a lip balm   Neurological: Negative for light-headedness and headaches.   Hematological: Does not bruise/bleed easily.   Psychiatric/Behavioral: Negative for dysphoric mood. The patient is not nervous/anxious.    All other systems reviewed and are negative.       Objective:   BP 113/76 (BP Site: Left arm, Patient Position: Sitting, Cuff Size: Medium)    Pulse (!) 101    Temp 98 F (36.7 C) (Temporal)    Resp 14    Ht 1.5 m (4' 11.06")    Wt 69.3 kg (152 lb 12.8 oz)    LMP 08/17/2019 (Approximate)    BMI 30.80 kg/m   Wt Readings from Last 3 Encounters:   09/14/19 69.3 kg (152 lb 12.8 oz)   09/08/18 63.7 kg (140 lb 6.4 oz)   04/24/17 61.3 kg (135 lb 3.2 oz)     Physical Exam  Vitals reviewed.   Constitutional:       General: She is not in acute distress.     Appearance: Normal appearance. She is well-developed.   HENT:      Head: Normocephalic and atraumatic.      Right Ear: Hearing, tympanic membrane, ear canal and external ear normal.      Left Ear: Hearing, tympanic membrane, ear canal and external ear normal.      Mouth/Throat:      Pharynx: Uvula midline.   Eyes:      Conjunctiva/sclera: Conjunctivae normal.      Pupils: Pupils are equal, round, and reactive to light.   Neck:      Thyroid: No thyroid mass or thyromegaly.      Vascular: No carotid bruit.      Trachea: Trachea normal.   Cardiovascular:      Rate and Rhythm: Normal rate and regular rhythm.  Heart sounds: S1 normal and S2 normal. No murmur heard.     Pulmonary:      Effort: Pulmonary effort is normal.      Breath sounds: Normal breath sounds. No decreased breath sounds, wheezing, rhonchi or rales.   Abdominal:      General: Bowel sounds are  normal. There is no distension.      Palpations: Abdomen is soft. There is no mass.      Tenderness: There is no abdominal tenderness.   Musculoskeletal:      Cervical back: Neck supple. No edema.   Lymphadenopathy:      Cervical: No cervical adenopathy.      Upper Body:      Right upper body: No supraclavicular adenopathy.      Left upper body: No supraclavicular adenopathy.   Skin:     General: Skin is warm and dry.      Findings: No rash.      Comments: Darkening of skin of upper lip, no rash   Psychiatric:         Behavior: Behavior is cooperative.          Assessment/Plan:       1. Routine general medical examination at a health care facility  - Lipid panel  - Basic Metabolic Panel    2. Discoloration of skin  - Ambulatory referral to Allergy    3. Screening for cervical cancer  - ThinPrep(R) & HPV mRNA E6/E7 rflx HPV 16      Colon cancer screening: not indicated  Breast cancer screening: not indicated  Cervical cancer screening: Pap performed today. Choosing wisely regarding pap smears given for patient information  Osteoporosis screening: not indicated  Hepatitis C Screening:  Lab Results   Component Value Date    HCVAB Non-Reactive 09/08/2018     HIV screening - Reviewed risks and benefits of testing, the implications of HIV test results, and how test results will be communicated - patient declines screening and had no questions.    Discussed the importance of regular health exams, dental and eye exams.  Counseled on nutrition, regular aerobic/strenthening exercise, and maintaining/reaching a healthy weight.  BMI reviewed. Counseled on avoiding misuse of alcohol and avoiding/cessation of tobacco and drug use.  Immunizations recommended as per ACIP. Discussed and ordered age/gender appropriate screening tests.   Handout given to patient on health maintenance/preventive care      HIgh BMI Follow Up   BMI Follow Up Care Plan Documented   Encouragement to Exercise   Counseled on increasing exercise - Recommend  at least 2.5 hours per week of moderate intensity aerobic physical activity plus muscle strengthening activities that involve all major muscle groups on 2 or more days per week and Counseled on dietary modification - making heart healthy dietary changes, meal planning, portion control    Return in about 1 year (around 09/13/2020) for Annual Physical, Return sooner as needed.    Jules Husbands, DO

## 2019-09-14 ENCOUNTER — Encounter (INDEPENDENT_AMBULATORY_CARE_PROVIDER_SITE_OTHER): Payer: Self-pay | Admitting: Family Medicine

## 2019-09-14 ENCOUNTER — Ambulatory Visit (INDEPENDENT_AMBULATORY_CARE_PROVIDER_SITE_OTHER): Payer: No Typology Code available for payment source | Admitting: Family Medicine

## 2019-09-14 VITALS — BP 113/76 | HR 101 | Temp 98.0°F | Resp 14 | Ht 59.06 in | Wt 152.8 lb

## 2019-09-14 DIAGNOSIS — Z124 Encounter for screening for malignant neoplasm of cervix: Secondary | ICD-10-CM

## 2019-09-14 DIAGNOSIS — L819 Disorder of pigmentation, unspecified: Secondary | ICD-10-CM

## 2019-09-14 DIAGNOSIS — Z0000 Encounter for general adult medical examination without abnormal findings: Secondary | ICD-10-CM

## 2019-09-14 LAB — BASIC METABOLIC PANEL
Anion Gap: 9 (ref 5.0–15.0)
BUN: 17 mg/dL (ref 7.0–19.0)
CO2: 25 mEq/L (ref 21–29)
Calcium: 10.1 mg/dL (ref 8.5–10.5)
Chloride: 103 mEq/L (ref 100–111)
Creatinine: 0.8 mg/dL (ref 0.4–1.5)
Glucose: 83 mg/dL (ref 70–100)
Potassium: 4.1 mEq/L (ref 3.5–5.1)
Sodium: 137 mEq/L (ref 136–145)

## 2019-09-14 LAB — LIPID PANEL
Cholesterol / HDL Ratio: 2.8
Cholesterol: 167 mg/dL (ref 0–199)
HDL: 60 mg/dL (ref 40–9999)
LDL Calculated: 98 mg/dL (ref 0–99)
Triglycerides: 46 mg/dL (ref 34–149)
VLDL Calculated: 9 mg/dL — ABNORMAL LOW (ref 10–40)

## 2019-09-14 LAB — HEMOLYSIS INDEX: Hemolysis Index: 7 (ref 0–18)

## 2019-09-14 LAB — GFR: EGFR: >60

## 2019-09-14 NOTE — Progress Notes (Signed)
Have you seen any specialists/other providers since your last visit with [place]?    Yes Dermotoligist    Arm preference verified?   Yes    The patient is due for pap Smear, Influenza vaccine,

## 2019-09-14 NOTE — Patient Instructions (Signed)
Prevention Guidelines, Women Ages 18 to 39  Screening tests and vaccines are an important part of managing your health. A screening test is done to find possible disorders or diseases in people who don't have any symptoms. The goal is to find a disease early so lifestyle changes can be made and you can be watched more closely to reduce the risk of disease, or to detect it early enough to treat it most effectively. Screening tests are not considered diagnostic, but are used to determine if more testing is needed. Health counseling is essential, too. Below are guidelines for these, for women ages 18 to 39. Talk with your healthcare provider to make sure you’re up-to-date on what you need.   Screening Who needs it How often   Alcohol misuse All women in this age group  At routine exams   Blood pressure All women in this age group  Yearly checkup if your blood pressure is normal   Normal blood pressure is less than 120/80 mm Hg   If your blood pressure reading is higher than normal, follow the advice of your healthcare provider    Breast cancer All women in this age group should talk with their healthcare providers about the need for clinical breast exams (CBE)1  Clinical breast exam every 3 years1    Cervical cancer Women ages 21 and older  Women between ages 21 and 29 should have a Pap test every 3 years; women between ages 30 and 65 are advised to have a Pap test plus an HPV test every 5 years    Chlamydia Sexually active women ages 24 and younger, and women at increased risk for infection  Every 3 years if you're at risk or have symptoms    Depression All women in this age group  At routine exams   Diabetes mellitus, type 2  Adults with no symptoms who are overweight or obese and have 1 or more other risk factors for diabetes  At least every 3 years. Also, testing for diabetes during pregnancy after the 24th week.     Gonorrhea Sexually active women at increased risk for infection  At routine exams   Hepatitis C  Anyone at increased risk  At routine exams   HIV All women At routine exams3     Obesity All women in this age group  At routine exams   Syphilis Women at increased risk for infection should talk with their healthcare provider  At routine exams   Tuberculosis Women at increased risk for infection should talk with their healthcare provider  Ask your healthcare provider    Vision All women in this age group  At least 1 complete exam in your 20s, and 2 in your 30s    Vaccine Who needs it How often   Chickenpox (varicella)  All women in this age group who have no record of this infection or vaccine  2 doses; the second dose should be given 4 to 8 weeks after the first dose    Hepatitis A Women at increased risk for infection should talk with their healthcare provider  2 doses given at least 6 months apart    Hepatitis B Women at increased risk for infection should talk with their healthcare provider  3 doses over 6 months; second dose should be given 1 month after the first dose; the third dose should be given at least 2 months after the second dose and at least 4 months after the first dose    Haemophilus   influenzae  Type B (HIB)  Women at increased risk for infection should talk with their healthcare provider  1 to 3 doses   Human papillomavirus (HPV)  All women in this age group up to age 26  3 doses; the second dose should be given 1 to 2 months after the first dose and the third dose given 6 months after the first dose    Influenza (flu) All women in this age group  Once a year   Measles, mumps, rubella (MMR)  All women in this age group who have no record of these infections or vaccines  1 or 2 doses   Meningococcal Women at increased risk for infection should talk with their healthcare provider  1 or more doses   Pneumococcal conjugate vaccine (PCV13) and pneumococcal polysaccharide vaccine (PPSV23)  Women at increased risk for infection should talk with their healthcare provider  PCV13: 1 dose ages 19 to 65  (protects against 13 types of pneumococcal bacteria)   PPSV23: 1 to 2 doses through age 64, or 1 dose at 65 or older (protects against 23 types of pneumococcal bacteria)    Tetanus/diphtheria/pertussis (Td/Tdap) booster All women in this age group  Td every 10 years, or a one-time dose of Tdap instead of a Td booster after age 18, then Td every 10 years    Counseling Who needs it How often   BRCA gene mutation testing for breast and ovarian cancer susceptibility  Women with increased risk for having gene mutation  When your risk is known    Breast cancer and chemoprevention  Women at high risk for breast cancer  When your risk is known    Diet and exercise Women who are overweight or obese  When diagnosed, and then at routine exams    Domestic violence Women at the age in which they are able to have children  At routine exams   Sexually transmitted infection prevention  Women who are sexually active  At routine exams   Skin cancer Prevention of skin cancer in fair-skinned adults  At routine exams   Use of tobacco and the health effects it can cause  All women in this age group  Every visit   1 According to the ACS, women ages 20 to 39 years should have a clinical breast exam (CBE) as part of their routine health exam every 3 years. Breast self-exams are an option for women starting in their 20s. But the U.S. Preventive Services Task Force (USPSTF) does not recommend CBE.   2 Those who are 33 years old and not up-to-date on their childhood vaccines should get all appropriate catch-up vaccines recommended by the CDC.   3 The USPSTF recommends that all people ages 15 to 65 years be screened for HIV and those younger or older people at increased risk. The CDC recommends that everyone between the ages of 13 and 64 get tested for HIV at least once as part of routine health care.   [employer] last reviewed this educational content on 10/08/2015  © 2000-2021 [employer]. All rights reserved. This information is  not intended as a substitute for professional medical care. Always follow your healthcare professional's instructions.

## 2019-09-15 ENCOUNTER — Encounter (INDEPENDENT_AMBULATORY_CARE_PROVIDER_SITE_OTHER): Payer: Self-pay

## 2019-09-17 LAB — THINPREP(R) & HPV MRNA E6/E7 RFLX HPV 16,18/45: HPV RNA, High Risk, E6-E7, TMA: NOT DETECTED

## 2019-09-30 ENCOUNTER — Encounter (INDEPENDENT_AMBULATORY_CARE_PROVIDER_SITE_OTHER): Payer: Self-pay | Admitting: Family Medicine

## 2019-09-30 ENCOUNTER — Ambulatory Visit (INDEPENDENT_AMBULATORY_CARE_PROVIDER_SITE_OTHER): Payer: No Typology Code available for payment source | Admitting: Family Medicine

## 2019-09-30 VITALS — BP 120/84 | HR 78 | Temp 97.8°F | Resp 14 | Ht 58.27 in | Wt 152.8 lb

## 2019-09-30 DIAGNOSIS — R87619 Unspecified abnormal cytological findings in specimens from cervix uteri: Secondary | ICD-10-CM

## 2019-09-30 DIAGNOSIS — Z23 Encounter for immunization: Secondary | ICD-10-CM
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
## 2019-09-30 NOTE — Progress Notes (Signed)
Have you seen any specialists/other providers since your last visit with us?    Yes  Dermotologist  Arm preference verified?   Yes    The patient is due for nothing at this time, HM is up-to-date.

## 2019-10-01 ENCOUNTER — Encounter (INDEPENDENT_AMBULATORY_CARE_PROVIDER_SITE_OTHER): Payer: Self-pay | Admitting: Family Medicine

## 2019-10-03 LAB — SURESWAB(TM) PLUS
Atopobium vaginae: 7.3
Candida Glabrata,DNA: NOT DETECTED
Candida Parapsilosis,DNA: NOT DETECTED
Candida Tropicalis,DNA: NOT DETECTED
Candida albicans DNA: NOT DETECTED
Chlamydia trachomatis RNA, TMA: NOT DETECTED
Gardnerella vaginalis: >8
Lactobacillus species: NOT DETECTED
Megasphaera species: 8
N. gonorrhoeae RNA, TMA: NOT DETECTED
Trichomonas Vaginalis RNA, QL TMA: NOT DETECTED

## 2019-10-04 ENCOUNTER — Other Ambulatory Visit (INDEPENDENT_AMBULATORY_CARE_PROVIDER_SITE_OTHER): Payer: Self-pay | Admitting: Family Medicine

## 2019-10-04 ENCOUNTER — Encounter (INDEPENDENT_AMBULATORY_CARE_PROVIDER_SITE_OTHER): Payer: Self-pay | Admitting: Family Medicine

## 2019-10-04 DIAGNOSIS — N760 Acute vaginitis: Secondary | ICD-10-CM

## 2019-10-04 MED ORDER — METRONIDAZOLE 500 MG PO TABS
500.0000 mg | ORAL_TABLET | Freq: Two times a day (BID) | ORAL | 0 refills | Status: AC
Start: 2019-10-04 — End: 2019-10-11

## 2019-10-15 ENCOUNTER — Encounter (INDEPENDENT_AMBULATORY_CARE_PROVIDER_SITE_OTHER): Payer: Self-pay

## 2019-11-02 ENCOUNTER — Ambulatory Visit (INDEPENDENT_AMBULATORY_CARE_PROVIDER_SITE_OTHER): Payer: No Typology Code available for payment source | Admitting: Family Medicine

## 2019-11-15 ENCOUNTER — Encounter (INDEPENDENT_AMBULATORY_CARE_PROVIDER_SITE_OTHER): Payer: Self-pay

## 2019-12-15 ENCOUNTER — Encounter (INDEPENDENT_AMBULATORY_CARE_PROVIDER_SITE_OTHER): Payer: Self-pay

## 2020-01-15 ENCOUNTER — Encounter (INDEPENDENT_AMBULATORY_CARE_PROVIDER_SITE_OTHER): Payer: Self-pay

## 2020-02-15 ENCOUNTER — Encounter (INDEPENDENT_AMBULATORY_CARE_PROVIDER_SITE_OTHER): Payer: Self-pay

## 2020-03-23 ENCOUNTER — Ambulatory Visit (INDEPENDENT_AMBULATORY_CARE_PROVIDER_SITE_OTHER): Payer: No Typology Code available for payment source | Admitting: Family Medicine

## 2020-04-14 ENCOUNTER — Ambulatory Visit (INDEPENDENT_AMBULATORY_CARE_PROVIDER_SITE_OTHER): Payer: No Typology Code available for payment source | Admitting: Internal Medicine

## 2020-04-14 ENCOUNTER — Encounter (INDEPENDENT_AMBULATORY_CARE_PROVIDER_SITE_OTHER): Payer: Self-pay | Admitting: Internal Medicine

## 2020-04-14 ENCOUNTER — Encounter (INDEPENDENT_AMBULATORY_CARE_PROVIDER_SITE_OTHER): Payer: Self-pay

## 2020-04-14 VITALS — BP 122/82 | HR 78 | Temp 98.0°F | Resp 22 | Ht 59.0 in | Wt 161.0 lb

## 2020-04-14 DIAGNOSIS — F4329 Adjustment disorder with other symptoms: Secondary | ICD-10-CM

## 2020-04-14 DIAGNOSIS — F419 Anxiety disorder, unspecified: Secondary | ICD-10-CM
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
## 2020-05-14 ENCOUNTER — Encounter (INDEPENDENT_AMBULATORY_CARE_PROVIDER_SITE_OTHER): Payer: Self-pay

## 2020-06-14 ENCOUNTER — Encounter (INDEPENDENT_AMBULATORY_CARE_PROVIDER_SITE_OTHER): Payer: Self-pay

## 2020-07-14 ENCOUNTER — Encounter (INDEPENDENT_AMBULATORY_CARE_PROVIDER_SITE_OTHER): Payer: Self-pay

## 2020-08-14 ENCOUNTER — Encounter (INDEPENDENT_AMBULATORY_CARE_PROVIDER_SITE_OTHER): Payer: Self-pay

## 2020-09-14 ENCOUNTER — Encounter (INDEPENDENT_AMBULATORY_CARE_PROVIDER_SITE_OTHER): Payer: Self-pay

## 2020-10-14 ENCOUNTER — Encounter (INDEPENDENT_AMBULATORY_CARE_PROVIDER_SITE_OTHER): Payer: Self-pay

## 2020-10-23 ENCOUNTER — Telehealth (INDEPENDENT_AMBULATORY_CARE_PROVIDER_SITE_OTHER): Payer: Self-pay | Admitting: Family Medicine

## 2020-10-23 NOTE — Telephone Encounter (Signed)
Please request records from [facility].  Will be able to place referral upon review of records.

## 2020-10-23 NOTE — Telephone Encounter (Signed)
Patient would like to get a referral for Gastroenterologist as soon as possible. She went to [facility] Friday. She has blood in the stool. She can be reached at [phone].

## 2020-10-24 ENCOUNTER — Telehealth (INDEPENDENT_AMBULATORY_CARE_PROVIDER_SITE_OTHER): Payer: Self-pay | Admitting: Family Medicine

## 2020-10-24 NOTE — Telephone Encounter (Signed)
Patient states she went to:    [facility] [place]    Address: [address] [place] [address]  Hours:   Open ? Closes 8PM  Phone: [phone]    FYI

## 2020-10-26 ENCOUNTER — Encounter (INDEPENDENT_AMBULATORY_CARE_PROVIDER_SITE_OTHER): Payer: Self-pay | Admitting: Family Medicine

## 2020-10-26 NOTE — Telephone Encounter (Signed)
Please reach out to patient to schedule an appointment for evaluation to address her request.

## 2020-10-27 ENCOUNTER — Encounter (INDEPENDENT_AMBULATORY_CARE_PROVIDER_SITE_OTHER): Payer: Self-pay | Admitting: Family Medicine

## 2020-10-27 ENCOUNTER — Encounter (INDEPENDENT_AMBULATORY_CARE_PROVIDER_SITE_OTHER): Payer: Self-pay

## 2020-10-27 ENCOUNTER — Other Ambulatory Visit (INDEPENDENT_AMBULATORY_CARE_PROVIDER_SITE_OTHER): Payer: Self-pay | Admitting: Family Medicine

## 2020-10-27 DIAGNOSIS — R195 Other fecal abnormalities: Secondary | ICD-10-CM

## 2020-10-27 NOTE — Telephone Encounter (Signed)
Reviewed UC note.    Referral to GI entered  Please notify patient thanks

## 2020-10-27 NOTE — Telephone Encounter (Signed)
Called patient unable to leave message. Just ringing.

## 2020-10-29 NOTE — Progress Notes (Deleted)
Dr. Marlise Eves?, VS      For blood in stools, pt reports first noted on XX  Denied any pain with bowel movements. No new foods.       For Vit D deficiency, pt takes 1000 IU daily.     09/2019  Needs CBC, CMP, Vit D, and Lipid   Flu

## 2020-10-31 ENCOUNTER — Ambulatory Visit (INDEPENDENT_AMBULATORY_CARE_PROVIDER_SITE_OTHER): Payer: No Typology Code available for payment source | Admitting: Surgery

## 2020-11-09 ENCOUNTER — Ambulatory Visit (INDEPENDENT_AMBULATORY_CARE_PROVIDER_SITE_OTHER): Payer: No Typology Code available for payment source | Admitting: Family Medicine

## 2020-11-14 ENCOUNTER — Encounter (INDEPENDENT_AMBULATORY_CARE_PROVIDER_SITE_OTHER): Payer: Self-pay

## 2020-11-21 ENCOUNTER — Encounter (INDEPENDENT_AMBULATORY_CARE_PROVIDER_SITE_OTHER): Payer: Self-pay | Admitting: Family Medicine

## 2020-11-24 DIAGNOSIS — K5120 Ulcerative (chronic) proctitis without complications: Secondary | ICD-10-CM | POA: Insufficient documentation

## 2020-11-28 ENCOUNTER — Encounter (INDEPENDENT_AMBULATORY_CARE_PROVIDER_SITE_OTHER): Payer: Self-pay | Admitting: Family Medicine

## 2020-12-12 ENCOUNTER — Encounter (INDEPENDENT_AMBULATORY_CARE_PROVIDER_SITE_OTHER): Payer: Self-pay | Admitting: Family Medicine

## 2020-12-14 ENCOUNTER — Encounter (INDEPENDENT_AMBULATORY_CARE_PROVIDER_SITE_OTHER): Payer: Self-pay | Admitting: Family Medicine

## 2020-12-14 ENCOUNTER — Ambulatory Visit (INDEPENDENT_AMBULATORY_CARE_PROVIDER_SITE_OTHER): Payer: No Typology Code available for payment source | Admitting: Family Medicine

## 2020-12-14 ENCOUNTER — Encounter (INDEPENDENT_AMBULATORY_CARE_PROVIDER_SITE_OTHER): Payer: Self-pay

## 2020-12-14 VITALS — BP 128/78 | HR 78 | Temp 97.8°F | Resp 20 | Ht 58.25 in | Wt 160.2 lb

## 2020-12-14 DIAGNOSIS — Z0000 Encounter for general adult medical examination without abnormal findings: Secondary | ICD-10-CM | POA: Insufficient documentation

## 2020-12-14 DIAGNOSIS — Z23 Encounter for immunization: Secondary | ICD-10-CM

## 2020-12-14 DIAGNOSIS — K51219 Ulcerative (chronic) proctitis with unspecified complications: Secondary | ICD-10-CM

## 2020-12-14 LAB — CBC AND DIFFERENTIAL
Absolute NRBC: 0 10*3/uL (ref 0.00–0.00)
Basophils Absolute Automated: 0.03 10*3/uL (ref 0.00–0.08)
Basophils Automated: 0.6 %
Eosinophils Absolute Automated: 0.12 10*3/uL (ref 0.00–0.44)
Eosinophils Automated: 2.5 %
Hematocrit: 38.3 % (ref 34.7–43.7)
Hgb: 12.8 g/dL (ref 11.4–14.8)
Immature Granulocytes Absolute: 0.01 10*3/uL (ref 0.00–0.07)
Immature Granulocytes: 0.2 %
Lymphocytes Absolute Automated: 2.2 10*3/uL (ref 0.42–3.22)
Lymphocytes Automated: 45 %
MCH: 30.3 pg (ref 25.1–33.5)
MCHC: 33.4 g/dL (ref 31.5–35.8)
MCV: 90.8 fL (ref 78.0–96.0)
MPV: 10.7 fL (ref 8.9–12.5)
Monocytes Absolute Automated: 0.41 10*3/uL (ref 0.21–0.85)
Monocytes: 8.4 %
Neutrophils Absolute: 2.12 10*3/uL (ref 1.10–6.33)
Neutrophils: 43.3 %
Nucleated RBC: 0 /100 WBC (ref 0.0–0.0)
Platelets: 313 10*3/uL (ref 142–346)
RBC: 4.22 10*6/uL (ref 3.90–5.10)
RDW: 13 % (ref 11–15)
WBC: 4.89 10*3/uL (ref 3.10–9.50)

## 2020-12-14 LAB — COMPREHENSIVE METABOLIC PANEL
ALT: 23 U/L (ref 0–55)
AST (SGOT): 19 U/L (ref 5–41)
Albumin/Globulin Ratio: 1.5 (ref 0.9–2.2)
Albumin: 4.3 g/dL (ref 3.5–5.0)
Alkaline Phosphatase: 56 U/L (ref 37–117)
Anion Gap: 9 (ref 5.0–15.0)
BUN: 17 mg/dL (ref 7.0–21.0)
Bilirubin, Total: 0.6 mg/dL (ref 0.2–1.2)
CO2: 24 mEq/L (ref 17–29)
Calcium: 9.7 mg/dL (ref 8.5–10.5)
Chloride: 104 mEq/L (ref 99–111)
Creatinine: 0.8 mg/dL (ref 0.4–1.0)
Globulin: 2.8 g/dL (ref 2.0–3.6)
Glucose: 80 mg/dL (ref 70–100)
Potassium: 3.8 mEq/L (ref 3.5–5.3)
Protein, Total: 7.1 g/dL (ref 6.0–8.3)
Sodium: 137 mEq/L (ref 135–145)

## 2020-12-14 LAB — TSH: TSH: 1.4 u[IU]/mL (ref 0.35–4.94)

## 2020-12-14 LAB — HEMOLYSIS INDEX: Hemolysis Index: 6 Index (ref 0–24)

## 2020-12-14 LAB — HEMOGLOBIN A1C
Average Estimated Glucose: 102.5 mg/dL
Hemoglobin A1C: 5.2 % (ref 4.6–5.9)

## 2020-12-14 LAB — LIPID PANEL
Cholesterol / HDL Ratio: 3.5 Index
Cholesterol: 193 mg/dL (ref 0–199)
HDL: 55 mg/dL (ref 40–9999)
LDL Calculated: 125 mg/dL — ABNORMAL HIGH (ref 0–99)
Triglycerides: 65 mg/dL (ref 34–149)
VLDL Calculated: 13 mg/dL (ref 10–40)

## 2020-12-14 LAB — VITAMIN D,25 OH,TOTAL: Vitamin D, 25 OH, Total: 20 ng/mL — ABNORMAL LOW (ref 30–100)

## 2020-12-14 LAB — GFR: EGFR: >60

## 2020-12-14 NOTE — Progress Notes (Signed)
Subjective:    Date: 12/14/2020 2:20 PM   Patient ID: Katherine Evans is a 35 y.o. female.    Chief Complaint   Patient presents with    Annual Exam     HPI    Pt presents for routine health maintenance visit/physical    Diet: balanced diet  Exercise: Reviewed in the Social Determinants of Health section    Patient Active Problem List    Diagnosis Date Noted    Preventative health care 12/14/2020       Pt thinks she has had the HPV vaccination.    sexualy active, 1 partner monogamous relationship.        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 Fermin Schwab [place]    Pt reports advised by GI to follow low fiber diet during flare.    Med:   Canasa rectal suppository 1 gram HS    Colonoscopy 11/23/2020: Repeat colonoscopy in 5 years.       Fibrocystic breast changes of both breasts 04/23/2016    History of keloid of skin 04/23/2016     Patient Care Team:  Jules Husbands, DO as PCP - General (Family Medicine)  Immunization History   Administered Date(s) Administered    COVID-19 mRNA MONOVALENT vaccine PRIMARY SERIES 12 years and above (Moderna) 100 mcg/0.5 mL 01/21/2019, 02/19/2019, 11/24/2019    Influenza quad 6 MOS to 64 YRS (Flulaval/Fluarix) 09/30/2019, 12/14/2020    Influenza quadrivalent (IM) PF 3 Yrs & greater 09/08/2018    Tdap 04/24/2017     Current Outpatient Medications   Medication Sig Dispense Refill    mesalamine (CANASA) 1000 MG suppository INSERT 1 SUPPOSITORY RECTALLY EVERY DAY AT BEDTIME      Multiple Vitamin (MULTIVITAMIN ADULT PO) Take by mouth      vitamin D (CHOLECALCIFEROL) 25 MCG (1000 UT) tablet Take 1,000 Units by mouth daily       No current facility-administered medications for this visit.     No Known Allergies    Past Medical History:   Diagnosis Date    Ulcerative proctitis      Past Surgical History:   Procedure Laterality Date    KELOID EXCISION Bilateral 2013    ears     Family History   Problem Relation Age of Onset    No known problems Mother     No known problems Father     No  known problems Sister     No known problems Brother     Colon cancer Paternal Uncle     Pancreatic cancer Maternal Grandfather     Stomach cancer Maternal Grandfather     Diabetes Paternal Grandfather     Heart disease Paternal Grandfather     Inflammatory bowel disease Paternal Grandfather     Breast cancer Neg Hx      Social History     Socioeconomic History    Marital status: Single   Occupational History    Occupation: resident Merchandiser, retail   Tobacco Use    Smoking status: Never    Smokeless tobacco: Never    Tobacco comments:     Hookah once every 2 weeks   Vaping Use    Vaping Use: Some days   Substance and Sexual Activity    Alcohol use: Yes     Alcohol/week: 2.0 standard drinks     Types: 2 Glasses of wine per week     Comment: once /month; 1 -2 drinks/episode    Drug  use: No    Sexual activity: Not Currently     Partners: Male     Birth control/protection: Condom     Social Determinants of Health     Physical Activity: Sufficiently Active    Days of Exercise per Week: 3 days    Minutes of Exercise per Session: 50 min   Stress: Stress Concern Present    Feeling of Stress : Very much     The following portions of the patient's history were reviewed and updated as appropriate: allergies, current medications, past family history, past medical history, past social history, past surgical history and problem list.    Review of Systems   Constitutional:  Negative for chills, fatigue and fever.   HENT:  Negative for congestion and sore throat.    Eyes:  Negative for pain and visual disturbance.   Respiratory:  Negative for cough and shortness of breath.    Cardiovascular:  Negative for chest pain.   Gastrointestinal:  Positive for blood in stool, constipation and diarrhea. Negative for abdominal pain.        Pt reports constipation/diarrhea has improved.  Also blood in stool has lessened.    Endocrine: Negative for cold intolerance and heat intolerance.   Genitourinary:  Negative for difficulty urinating and dysuria.    Musculoskeletal:  Negative for arthralgias and back pain.   Skin:  Negative for rash.   Neurological:  Negative for light-headedness and headaches.   Hematological:  Does not bruise/bleed easily.   Psychiatric/Behavioral:  Negative for decreased concentration, dysphoric mood and sleep disturbance. The patient is nervous/anxious.    All other systems reviewed and are negative.       Objective:   BP 128/78 (BP Site: Left arm, Patient Position: Sitting, Cuff Size: Medium)   Pulse 78   Temp 97.8 F (36.6 C)   Resp 20   Ht 1.48 m (4' 10.25")   Wt 72.7 kg (160 lb 3.2 oz)   LMP 11/26/2020 (Approximate)   BMI 33.20 kg/m   Wt Readings from Last 3 Encounters:   12/14/20 72.7 kg (160 lb 3.2 oz)   04/14/20 73 kg (161 lb)   09/30/19 69.3 kg (152 lb 12.8 oz)     Physical Exam  Vitals reviewed.   Constitutional:       General: She is not in acute distress.     Appearance: Normal appearance. She is well-developed.   HENT:      Head: Normocephalic and atraumatic.      Right Ear: Hearing, tympanic membrane, ear canal and external ear normal.      Left Ear: Hearing, tympanic membrane, ear canal and external ear normal.      Mouth/Throat:      Pharynx: Uvula midline.   Eyes:      Conjunctiva/sclera: Conjunctivae normal.      Pupils: Pupils are equal, round, and reactive to light.   Neck:      Thyroid: No thyroid mass or thyromegaly.      Vascular: No carotid bruit.      Trachea: Trachea normal.   Cardiovascular:      Rate and Rhythm: Normal rate and regular rhythm.      Heart sounds: S1 normal and S2 normal. No murmur heard.  Pulmonary:      Effort: Pulmonary effort is normal.      Breath sounds: Normal breath sounds. No decreased breath sounds, wheezing, rhonchi or rales.   Abdominal:      General: Bowel sounds are  normal. There is no distension.      Palpations: Abdomen is soft. There is no mass.      Tenderness: There is no abdominal tenderness.   Musculoskeletal:      Cervical back: Neck supple. No edema.    Lymphadenopathy:      Cervical: No cervical adenopathy.      Upper Body:      Right upper body: No supraclavicular adenopathy.      Left upper body: No supraclavicular adenopathy.   Skin:     General: Skin is warm and dry.      Findings: No rash.   Psychiatric:         Behavior: Behavior is cooperative.       Lab Results   Component Value Date    CHOL 167 09/14/2019    CHOL 181 09/08/2018    CHOL 188 04/24/2017     Lab Results   Component Value Date    HDL 60 09/14/2019    HDL 70 09/08/2018    HDL 69 04/24/2017     Lab Results   Component Value Date    LDL 98 09/14/2019    LDL 104 (H) 09/08/2018    LDL 111 (H) 04/24/2017     Lab Results   Component Value Date    TRIG 46 09/14/2019    TRIG 34 09/08/2018    TRIG 39 04/24/2017          Assessment/Plan:       1. Routine general medical examination at a health care facility  - CBC and differential  - TSH  - Comprehensive metabolic panel  - Lipid panel  - Vitamin D,25 OH, Total  - Hemoglobin A1C    2. Need for influenza vaccination  - Flu vaccine QUADRIVALENT (PF) 6 months and older (FLULAVAL/FLUARIX)    3. Ulcerative proctitis with complication    4. Preventative health care      Colon cancer screening: as above  Breast cancer screening: Not indicated  Cervical cancer screening: negative pap 09/2019, repeat 5 years from last.   Osteoporosis screening: not indicated  Hepatitis C Screening:  Lab Results   Component Value Date    HCVAB Non-Reactive 09/08/2018     HIV screening - low risk; sexualy active, 1 partner monogamous relationship.    Discussed the importance of regular health exams, dental and eye exams.  Counseled on nutrition, regular aerobic/strenthening exercise, and maintaining/reaching a healthy weight.  BMI reviewed. Counseled on avoiding misuse of alcohol and avoiding/cessation of tobacco and drug use.  Immunizations recommended as per ACIP. Discussed and ordered age/gender appropriate screening tests.   Handout given to patient on health  maintenance/preventive care      HIgh BMI Follow Up  BMI Follow Up Care Plan Documented  Encouragement to Exercise     Counseled on increasing exercise - Recommend at least 2.5 hours per week of moderate intensity aerobic physical activity plus muscle strengthening activities that involve all major muscle groups on 2 or more days per week and Counseled on dietary modification - making heart healthy dietary changes, meal planning, portion control      Return in about 1 year (around 12/14/2021) for Preventive Wellness Visit, Return sooner as needed.    Jules Husbands, DO

## 2020-12-14 NOTE — Patient Instructions (Signed)
Health Screening Guidelines, Women Ages 18 to 39   Screening tests and health counseling are a key part of managing your health. A screening test is done to find disorders or diseases in people who don't have any symptoms. Screening tests are not used to diagnose. They are used to find out if more testing is needed. The goal may be to find a disease early so it can be treated with more success. Or the goal may be to find a disease early so you can make lifestyle changes. You may need regular checkups to help you reduce your risk of disease.   Below are guidelines for women ages 18 to 39. Talk with your healthcare provider. Make sure you're up-to-date on what you need.   We understand gender is a spectrum. We may use gendered terms to talk about anatomy and health risk. Please use this information in a way that works best for you and your provider as you talk about your care.   Screening  Who needs it  How often    Alcohol misuse All women in this age group  At routine exams   Blood pressure All women in this age group  Once a year if your blood pressure is normal. Normal blood pressure is less than 120/80 mm Hg. If your blood pressure is higher than this, follow the advice of your healthcare provider.    Breast cancer All women in this age group should talk with their healthcare provider about a clinical breast exam (CBE).1  Clinical breast exam every 3 years    Cervical cancer Women ages 21 and older  Women ages 21 to 29 should have a Pap test every 3 years. Women ages 30 to 65 should have a Pap test and an HPV test every 5 years.    Chlamydia Women who are sexually active. This includes those who are pregnant or who are:   Age 24 or younger  Age 25 or older at higher risk for infection     At routine yearly exams   If pregnant, during early prenatal care visit. Repeat in 3rd trimester for women at higher risk.    Depression All women in this age group  At routine exams   Diabetes mellitus, type 2  Women with no  symptoms who are overweight or obese and have 1 or more other risk factors for diabetes  At least every 3 years. Testing in pregnancy after the 24th week.     Gonorrhea Women who are sexually active. This includes those who are pregnant or who are:   Age 24 or younger  Age 25 or older at higher risk for infection     At routine yearly exams   Test in pregnancy if age 25 or younger or if living in an area where gonorrhea is common    Hepatitis C Anyone at higher risk  At routine exams   HIV All women At routine exams and in all pregnant people    Obesity All women in this age group  At routine exams   Syphilis Women who are at higher risk for infection. Talk with your healthcare provider.  At routine exams   Tuberculosis Women who are at higher risk for infection. Talk with your healthcare provider.  Ask your healthcare provider    Vision All women in this age group  At least 1 full exam in your 20s, and 2 in your 30s    Health counseling  Who needs it    How often    BRCA gene mutation testing for breast and ovarian cancer risk  Women at higher risk for a gene mutation  When your risk is known    Breast cancer and chemoprevention  Women at high risk for breast cancer  When your risk is known    Diet and exercise Women who are overweight or obese  When diagnosed, and then at routine exams    Domestic violence All women in this age group  Every visit   Sexually transmitted infection (STI) prevention  Women who are sexually active  At routine exams   Skin cancer Women with pale skin  At routine exams   Use of tobacco and the health effects it can cause  All women in this age group  Every visit   The ACS advises all women ages 20 to 39 to have a clinical breast exam (CBE) every 3 years. Breast self-exams are a choice for women age 20 and older. But the U.S. Preventive Services Task Force (USPSTF) does not advise CBE.   The USPSTF advises that people ages 15 to 65 years to be screened for HIV. They advise this test for  younger or older people at higher risk. The CDC advises that all people ages 13 to 64 get tested for HIV at least 1 time.   [employer] last reviewed this educational content on 06/08/2019   2000-2022 [employer]. All rights reserved. This information is not intended as a substitute for professional medical care. Always follow your healthcare professional's instructions.
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
## 2020-12-14 NOTE — Progress Notes (Signed)
Have you seen any specialists/other providers since your last visit with us?    No     The patient is due for:   There are no preventive care reminders to display for this patient.

## 2020-12-18 ENCOUNTER — Other Ambulatory Visit (INDEPENDENT_AMBULATORY_CARE_PROVIDER_SITE_OTHER): Payer: Self-pay | Admitting: Family Medicine

## 2020-12-18 DIAGNOSIS — E559 Vitamin D deficiency, unspecified: Secondary | ICD-10-CM

## 2020-12-18 MED ORDER — HM VITAMIN D3 100 MCG (4000 UT) PO CAPS
4000.0000 [IU] | ORAL_CAPSULE | Freq: Every day | ORAL | 0 refills | Status: DC
Start: 2020-12-18 — End: 2021-07-06

## 2021-03-14 ENCOUNTER — Encounter (INDEPENDENT_AMBULATORY_CARE_PROVIDER_SITE_OTHER): Payer: Self-pay

## 2021-03-21 ENCOUNTER — Encounter (INDEPENDENT_AMBULATORY_CARE_PROVIDER_SITE_OTHER): Payer: Self-pay | Admitting: Family Medicine

## 2021-03-22 ENCOUNTER — Encounter (INDEPENDENT_AMBULATORY_CARE_PROVIDER_SITE_OTHER): Payer: Self-pay

## 2021-04-14 ENCOUNTER — Encounter (INDEPENDENT_AMBULATORY_CARE_PROVIDER_SITE_OTHER): Payer: Self-pay

## 2021-04-15 ENCOUNTER — Encounter (INDEPENDENT_AMBULATORY_CARE_PROVIDER_SITE_OTHER): Payer: Self-pay

## 2021-05-14 ENCOUNTER — Encounter (INDEPENDENT_AMBULATORY_CARE_PROVIDER_SITE_OTHER): Payer: Self-pay

## 2021-05-15 ENCOUNTER — Encounter (INDEPENDENT_AMBULATORY_CARE_PROVIDER_SITE_OTHER): Payer: Self-pay

## 2021-05-16 ENCOUNTER — Ambulatory Visit (INDEPENDENT_AMBULATORY_CARE_PROVIDER_SITE_OTHER): Payer: No Typology Code available for payment source | Admitting: Family Medicine

## 2021-06-07 ENCOUNTER — Ambulatory Visit (INDEPENDENT_AMBULATORY_CARE_PROVIDER_SITE_OTHER): Payer: No Typology Code available for payment source | Admitting: Family Medicine

## 2021-06-14 ENCOUNTER — Encounter (INDEPENDENT_AMBULATORY_CARE_PROVIDER_SITE_OTHER): Payer: Self-pay

## 2021-06-15 ENCOUNTER — Encounter (INDEPENDENT_AMBULATORY_CARE_PROVIDER_SITE_OTHER): Payer: Self-pay

## 2021-07-06 ENCOUNTER — Ambulatory Visit (INDEPENDENT_AMBULATORY_CARE_PROVIDER_SITE_OTHER): Payer: No Typology Code available for payment source | Admitting: Family Medicine

## 2021-07-06 ENCOUNTER — Encounter (INDEPENDENT_AMBULATORY_CARE_PROVIDER_SITE_OTHER): Payer: Self-pay | Admitting: Family Medicine

## 2021-07-06 VITALS — BP 140/87 | HR 103 | Temp 97.7°F | Ht 59.0 in | Wt 150.0 lb

## 2021-07-06 DIAGNOSIS — R221 Localized swelling, mass and lump, neck: Secondary | ICD-10-CM

## 2021-07-06 NOTE — Progress Notes (Signed)
Subjective:      Patient ID: Katherine Evans is a 36 y.o. female.    Chief Complaint:  Chief Complaint   Patient presents with    Mass     Lump on ear for approx 1.5 month, pt reports drainage       HPI:  Head / Neck Mass  Patient presents with a left parotid mass. The patient reports that the mass has been present for 6 weeks. The onset of the mass was gradual. Associated symptoms were positive for enlargement. There has not been a history of pain, bleeding, difficulty opening mouth, difficulty swallowing, fatigue, weight loss, fevers, night sweats. The recent exposure history has been negative.        Problem List:  Patient Active Problem List   Diagnosis    Fibrocystic breast changes of both breasts    History of keloid of skin    Ulcerative proctitis    Preventative health care       Current Medications:  Current Outpatient Medications   Medication Sig Dispense Refill    mesalamine (CANASA) 1000 MG suppository INSERT 1 SUPPOSITORY RECTALLY EVERY DAY AT BEDTIME      Multiple Vitamin (MULTIVITAMIN ADULT PO) Take by mouth       No current facility-administered medications for this visit.       Allergies:  No Known Allergies    Past Medical History:  Past Medical History:   Diagnosis Date    Ulcerative proctitis        Past Surgical History:  Past Surgical History:   Procedure Laterality Date    KELOID EXCISION Bilateral 2013    ears       Family History:  Family History   Problem Relation Age of Onset    No known problems Mother     No known problems Father     No known problems Sister     No known problems Brother     Colon cancer Paternal Uncle     Pancreatic cancer Maternal Grandfather     Stomach cancer Maternal Grandfather     Diabetes Paternal Grandfather     Heart disease Paternal Grandfather     Inflammatory bowel disease Paternal Grandfather     Breast cancer Neg Hx        Social History:  Social History     Socioeconomic History    Marital status: Single   Occupational History    Occupation: resident  Merchandiser, retailsupervisor   Tobacco Use    Smoking status: Never    Smokeless tobacco: Never    Tobacco comments:     Hookah once every 2 weeks   Vaping Use    Vaping Use: Some days   Substance and Sexual Activity    Alcohol use: Yes     Alcohol/week: 2.0 standard drinks of alcohol     Types: 2 Glasses of wine per week     Comment: once /month; 1 -2 drinks/episode    Drug use: No    Sexual activity: Not Currently     Partners: Male     Birth control/protection: Condom     Social Determinants of Health     Financial Resource Strain: Medium Risk (07/06/2021)    Overall Financial Resource Strain (CARDIA)     Difficulty of Paying Living Expenses: Somewhat hard   Food Insecurity: No Food Insecurity (07/06/2021)    Hunger Vital Sign     Worried About Running Out of Food in the Last Year: Never  true     Ran Out of Food in the Last Year: Never true   Transportation Needs: No Transportation Needs (07/06/2021)    PRAPARE - Therapist, art (Medical): No     Lack of Transportation (Non-Medical): No   Physical Activity: Sufficiently Active (07/06/2021)    Exercise Vital Sign     Days of Exercise per Week: 4 days     Minutes of Exercise per Session: 40 min   Stress: Stress Concern Present (07/06/2021)    [employer] of Occupational Health - Occupational Stress Questionnaire     Feeling of Stress : Rather much   Social Connections: Socially Isolated (07/06/2021)    Social Connection and Isolation Panel [NHANES]     Frequency of Communication with Friends and Family: Three times a week     Frequency of Social Gatherings with Friends and Family: Once a week     Attends Religious Services: Never     Database administrator or Organizations: No     Attends Banker Meetings: Never     Marital Status: Never married   Intimate Partner Violence: Unknown (07/06/2021)    Humiliation, Afraid, Rape, and Kick questionnaire     Fear of Current or Ex-Partner: Patient refused     Emotionally Abused: Patient refused      Physically Abused: Patient refused     Sexually Abused: Patient refused   Housing Stability: Low Risk  (07/06/2021)    Housing Stability Vital Sign     Unable to Pay for Housing in the Last Year: No     Number of Places Lived in the Last Year: 1     Unstable Housing in the Last Year: No        The following sections were reviewed this encounter by the provider:        ROS:  Review of Systems  Constitution:  Patient denies appetite changes, chills, fatigue, fever.  Skin:  Patient denies skin changes, skin lesions or rashes.  Eyes:  Patient denies discharge, itching, redness, blurred vision.  HENT:  +left side neck swelling   Respiratory:  Patient denies SOB, tightness, cough, wheezing.  Cardiovascular:  Patient denies chest pain, leg swelling, palpitations.   Gastrointestinal:  Patient denies Abd distention/pain, Abd masses, blood in stool, rectal pain/swelling/bleeding, constipation, nausea, vomiting, diarrhea.  Genitourinary:  Patient denies dysuria, flank pain, hematuria, urgency  Musculoskeletal:  Patient denies arthralgias, back pain, gait problem, myalgias, neck pain/stiffness  Endocrine:  Patient denies any cold/heat intolerance, polydipsia, polyphagia, polyuria.  Neurological:  Patient denies weakness, numbness, tingling, facial drop.  Psychiatric:  Denies any depression, anxiety, insomnia, si/hi/pi     Vitals:  BP 140/87   Pulse (!) 103   Temp 97.7 F (36.5 C)   Ht 1.499 m (4\' 11" )   Wt 68 kg (150 lb)   LMP 04/05/2021   BMI 30.30 kg/m      Objective:     Physical Exam:  Physical Exam   Patient alert, oriented x 3, no distress, comfortable  Skin: skin color, texture, turgor normal, no rashes or lesions.  Oropharynx: Lips, mucosa normal  Eyes: Conjunctivae and corneas clear.  Neck: No adenopathy, no JVD, supple, symmetrical, trachea midline.  Lungs: Breaths sounds present bilaterally, clear to auscultation, no wheezing, no rales, no rhonchi   Heart: Regular rate and rhythm, S1-S2 present, no murmurs, no  rubs, no gallops   Extremities: Extremities normal, atraumatic, no cyanosis or edema  Neuro: Normal without focal finding, mental status, speech normal.  Psychiatric: Normal mood and affect. behavior is normal. Judgment and thought content normal.        Assessment:     1. Lump in neck  - Ambulatory referral to ENT; Future      Plan:     Symptoms resolved at this time  Base on Hx and findings, most possible sporadic left parotid gland enlargement.  ENT referral       Tonia Brooms, MD

## 2021-07-06 NOTE — Progress Notes (Signed)
Have you seen any specialists/other providers since your last visit with us?    No      The patient was informed that the following HM items are still outstanding:   There are no preventive care reminders to display for this patient.

## 2021-07-14 ENCOUNTER — Encounter (INDEPENDENT_AMBULATORY_CARE_PROVIDER_SITE_OTHER): Payer: Self-pay

## 2021-07-15 ENCOUNTER — Encounter (INDEPENDENT_AMBULATORY_CARE_PROVIDER_SITE_OTHER): Payer: Self-pay

## 2021-08-14 ENCOUNTER — Encounter (INDEPENDENT_AMBULATORY_CARE_PROVIDER_SITE_OTHER): Payer: Self-pay

## 2021-08-15 ENCOUNTER — Encounter (INDEPENDENT_AMBULATORY_CARE_PROVIDER_SITE_OTHER): Payer: Self-pay

## 2021-09-14 ENCOUNTER — Encounter (INDEPENDENT_AMBULATORY_CARE_PROVIDER_SITE_OTHER): Payer: Self-pay

## 2021-09-15 ENCOUNTER — Encounter (INDEPENDENT_AMBULATORY_CARE_PROVIDER_SITE_OTHER): Payer: Self-pay

## 2021-10-14 ENCOUNTER — Encounter (INDEPENDENT_AMBULATORY_CARE_PROVIDER_SITE_OTHER): Payer: Self-pay

## 2021-10-15 ENCOUNTER — Encounter (INDEPENDENT_AMBULATORY_CARE_PROVIDER_SITE_OTHER): Payer: Self-pay

## 2021-11-14 ENCOUNTER — Encounter (INDEPENDENT_AMBULATORY_CARE_PROVIDER_SITE_OTHER): Payer: Self-pay

## 2021-11-15 ENCOUNTER — Encounter (INDEPENDENT_AMBULATORY_CARE_PROVIDER_SITE_OTHER): Payer: Self-pay

## 2021-12-06 ENCOUNTER — Encounter (INDEPENDENT_AMBULATORY_CARE_PROVIDER_SITE_OTHER): Payer: Self-pay | Admitting: Family Medicine

## 2021-12-06 ENCOUNTER — Other Ambulatory Visit (FREE_STANDING_LABORATORY_FACILITY): Payer: No Typology Code available for payment source

## 2021-12-06 ENCOUNTER — Ambulatory Visit (INDEPENDENT_AMBULATORY_CARE_PROVIDER_SITE_OTHER): Payer: No Typology Code available for payment source | Admitting: Family Medicine

## 2021-12-06 VITALS — BP 128/85 | HR 84 | Temp 98.0°F | Ht 59.0 in | Wt 153.4 lb

## 2021-12-06 DIAGNOSIS — K644 Residual hemorrhoidal skin tags: Secondary | ICD-10-CM

## 2021-12-06 DIAGNOSIS — E559 Vitamin D deficiency, unspecified: Secondary | ICD-10-CM

## 2021-12-06 DIAGNOSIS — K51219 Ulcerative (chronic) proctitis with unspecified complications: Secondary | ICD-10-CM

## 2021-12-06 LAB — VITAMIN D,25 OH,TOTAL: Vitamin D, 25 OH, Total: 35 ng/mL (ref 30–100)

## 2021-12-06 MED ORDER — HYDROCORTISONE (PERIANAL) 2.5 % EX CREA
TOPICAL_CREAM | Freq: Three times a day (TID) | CUTANEOUS | 0 refills | Status: DC
Start: 2021-12-06 — End: 2023-10-10

## 2021-12-06 NOTE — Progress Notes (Signed)
Subjective     Katherine Evans is a 36 y.o. female who presents today for   Chief Complaint   Patient presents with    Vaginal bump     Small bump, no pain, no itching, no drainage per pt      HPI  Pt felt a bump in vaginal area a few weeks ago.  Denies itch.  Pt was bathing and felt it.   Not causing pain.  No pain with intercourse.    Denies vaginal discharge  No issues with urination.   Reports regular bowel movements. Not straining.  Denies hematochezia, melena, or any blood per stool/rectum.    No problems updated.    Patient Active Problem List   Diagnosis    Fibrocystic breast changes of both breasts    History of keloid of skin    Ulcerative proctitis    Preventative health care     Current Outpatient Medications   Medication Sig Dispense Refill    hydrocortisone (ANUSOL-HC) 2.5 % rectal cream Place rectally 3 (three) times daily 30 g 0    Multiple Vitamin (MULTIVITAMIN ADULT PO) Take by mouth       No current facility-administered medications for this visit.     No Known Allergies    Patient Care Team:  Jules Husbands, DO as PCP - General (Family Medicine)    Review of Systems    Objective     BP 128/85 (BP Site: Left arm, Patient Position: Sitting, Cuff Size: Medium)   Pulse 84   Temp 98 F (36.7 C)   Ht 1.499 m (4\' 11" )   Wt 69.6 kg (153 lb 6.4 oz)   LMP 12/05/2021 (Exact Date)   BMI 30.98 kg/m   Wt Readings from Last 3 Encounters:   12/06/21 69.6 kg (153 lb 6.4 oz)   07/06/21 68 kg (150 lb)   12/14/20 72.7 kg (160 lb 3.2 oz)     Physical Exam  Abdominal:      Hernia: There is no hernia in the left inguinal area or right inguinal area.   Genitourinary:     Pubic Area: No rash.       Labia:         Right: No rash, tenderness, lesion or injury.         Left: No rash, tenderness or injury.       Urethra: No urethral pain or urethral lesion.      Adnexa: Right adnexa normal and left adnexa normal.        Right: No mass, tenderness or fullness.          Left: No mass, tenderness or fullness.         Rectum: External hemorrhoid present. No mass or tenderness.          Comments: Tampon in place  Lymphadenopathy:      Lower Body: No right inguinal adenopathy. No left inguinal adenopathy.         Diagnostic Tests:    Assessment & Plan     1. External hemorrhoid  - hydrocortisone (ANUSOL-HC) 2.5 % rectal cream; Place rectally 3 (three) times daily  Dispense: 30 g; Refill: 0  High fiber diet (gradual increase)  Adequate hydration  Patient advised to call or return to clinic if these symptoms worsen or fail to improve as anticipated.    2. Ulcerative proctitis with complication    Pt needs follow-up with GI. Pt to schedule.  Return for Preventive Wellness Visit, Return sooner as needed.

## 2021-12-06 NOTE — Progress Notes (Signed)
Have you seen any specialists/other providers since your last visit with us?    No        The patient is due for   Health Maintenance Due   Topic Date Due    COVID-19 Vaccine (6 - 2023-24 season) 09/07/2021

## 2021-12-10 ENCOUNTER — Encounter (INDEPENDENT_AMBULATORY_CARE_PROVIDER_SITE_OTHER): Payer: Self-pay | Admitting: Family Medicine

## 2021-12-10 NOTE — Progress Notes (Signed)
Update med list

## 2021-12-14 ENCOUNTER — Encounter (INDEPENDENT_AMBULATORY_CARE_PROVIDER_SITE_OTHER): Payer: Self-pay

## 2021-12-15 ENCOUNTER — Encounter (INDEPENDENT_AMBULATORY_CARE_PROVIDER_SITE_OTHER): Payer: Self-pay

## 2022-01-14 ENCOUNTER — Encounter (INDEPENDENT_AMBULATORY_CARE_PROVIDER_SITE_OTHER): Payer: Self-pay

## 2022-01-15 ENCOUNTER — Encounter (INDEPENDENT_AMBULATORY_CARE_PROVIDER_SITE_OTHER): Payer: Self-pay

## 2022-02-14 ENCOUNTER — Encounter (INDEPENDENT_AMBULATORY_CARE_PROVIDER_SITE_OTHER): Payer: Self-pay

## 2022-03-15 ENCOUNTER — Encounter (INDEPENDENT_AMBULATORY_CARE_PROVIDER_SITE_OTHER): Payer: Self-pay

## 2022-04-15 ENCOUNTER — Encounter (INDEPENDENT_AMBULATORY_CARE_PROVIDER_SITE_OTHER): Payer: Self-pay

## 2022-05-15 ENCOUNTER — Encounter (INDEPENDENT_AMBULATORY_CARE_PROVIDER_SITE_OTHER): Payer: Self-pay

## 2022-06-15 ENCOUNTER — Encounter (INDEPENDENT_AMBULATORY_CARE_PROVIDER_SITE_OTHER): Payer: Self-pay

## 2022-07-15 ENCOUNTER — Encounter (INDEPENDENT_AMBULATORY_CARE_PROVIDER_SITE_OTHER): Payer: Self-pay

## 2022-08-15 ENCOUNTER — Encounter (INDEPENDENT_AMBULATORY_CARE_PROVIDER_SITE_OTHER): Payer: Self-pay

## 2022-09-15 ENCOUNTER — Encounter (INDEPENDENT_AMBULATORY_CARE_PROVIDER_SITE_OTHER): Payer: Self-pay

## 2022-10-15 ENCOUNTER — Encounter (INDEPENDENT_AMBULATORY_CARE_PROVIDER_SITE_OTHER): Payer: Self-pay

## 2022-11-15 ENCOUNTER — Encounter (INDEPENDENT_AMBULATORY_CARE_PROVIDER_SITE_OTHER): Payer: Self-pay

## 2022-12-15 ENCOUNTER — Encounter (INDEPENDENT_AMBULATORY_CARE_PROVIDER_SITE_OTHER): Payer: Self-pay

## 2023-01-15 ENCOUNTER — Encounter (INDEPENDENT_AMBULATORY_CARE_PROVIDER_SITE_OTHER): Payer: Self-pay

## 2023-02-15 ENCOUNTER — Encounter (INDEPENDENT_AMBULATORY_CARE_PROVIDER_SITE_OTHER): Payer: Self-pay

## 2023-03-15 ENCOUNTER — Encounter (INDEPENDENT_AMBULATORY_CARE_PROVIDER_SITE_OTHER): Payer: Self-pay

## 2023-04-15 ENCOUNTER — Encounter (INDEPENDENT_AMBULATORY_CARE_PROVIDER_SITE_OTHER): Payer: Self-pay

## 2023-05-15 ENCOUNTER — Encounter (INDEPENDENT_AMBULATORY_CARE_PROVIDER_SITE_OTHER): Payer: Self-pay

## 2023-05-20 ENCOUNTER — Ambulatory Visit (INDEPENDENT_AMBULATORY_CARE_PROVIDER_SITE_OTHER)

## 2023-05-20 ENCOUNTER — Other Ambulatory Visit (INDEPENDENT_AMBULATORY_CARE_PROVIDER_SITE_OTHER): Payer: Self-pay | Admitting: Internal Medicine

## 2023-05-20 DIAGNOSIS — Z0000 Encounter for general adult medical examination without abnormal findings: Secondary | ICD-10-CM

## 2023-06-15 ENCOUNTER — Encounter (INDEPENDENT_AMBULATORY_CARE_PROVIDER_SITE_OTHER): Payer: Self-pay

## 2023-07-15 ENCOUNTER — Encounter (INDEPENDENT_AMBULATORY_CARE_PROVIDER_SITE_OTHER): Payer: Self-pay

## 2023-08-15 ENCOUNTER — Encounter (INDEPENDENT_AMBULATORY_CARE_PROVIDER_SITE_OTHER): Payer: Self-pay

## 2023-09-15 ENCOUNTER — Encounter (INDEPENDENT_AMBULATORY_CARE_PROVIDER_SITE_OTHER): Payer: Self-pay

## 2023-10-10 ENCOUNTER — Encounter (INDEPENDENT_AMBULATORY_CARE_PROVIDER_SITE_OTHER): Payer: Self-pay | Admitting: Internal Medicine

## 2023-10-10 ENCOUNTER — Ambulatory Visit (INDEPENDENT_AMBULATORY_CARE_PROVIDER_SITE_OTHER): Admitting: Internal Medicine

## 2023-10-10 VITALS — BP 126/80 | HR 80 | Temp 98.3°F | Ht 60.0 in | Wt 151.8 lb

## 2023-10-10 DIAGNOSIS — E559 Vitamin D deficiency, unspecified: Secondary | ICD-10-CM

## 2023-10-10 DIAGNOSIS — E785 Hyperlipidemia, unspecified: Secondary | ICD-10-CM

## 2023-10-10 DIAGNOSIS — Z8719 Personal history of other diseases of the digestive system: Secondary | ICD-10-CM

## 2023-10-10 DIAGNOSIS — Z131 Encounter for screening for diabetes mellitus: Secondary | ICD-10-CM

## 2023-10-10 DIAGNOSIS — Z0000 Encounter for general adult medical examination without abnormal findings: Secondary | ICD-10-CM

## 2023-10-10 NOTE — Progress Notes (Signed)
 Have you seen any specialists/other providers since your last visit with [place]?    No      The patient was informed that the following HM items are still outstanding:   There are no preventive care reminders to display for this patient.

## 2023-10-10 NOTE — Progress Notes (Signed)
 [facility] [place]          HPI     Chief Complaint   Patient presents with    Annual Exam     Non-Fasting     HPI  Patient is here for annual physical exam     [facility] , sees GI, last Cscope 2023.   BM normal no bloody stools.     Exercise, weights , cardio 3-4x/wk  Diet, regular 3 meals/day  Weight stable.     Vitamin D  def, taking otc vitamin D  1K daily    Hyperlipidemia, diet controlled  Lab Results   Component Value Date    CHOL 193 12/14/2020    CHOL 167 09/14/2019    CHOL 181 09/08/2018     Lab Results   Component Value Date    HDL 55 12/14/2020    HDL 60 09/14/2019    HDL 70 09/08/2018     Lab Results   Component Value Date    LDL 125 (H) 12/14/2020    LDL 98 09/14/2019    LDL 104 (H) 09/08/2018     Lab Results   Component Value Date    TRIG 65 12/14/2020    TRIG 46 09/14/2019    TRIG 34 09/08/2018      Problem List[1]  Current Medications[2]  Medical History[3]  Past Surgical History[4]  Family History[5]  Social History[6]    Review of Systems   Constitutional:  Negative for appetite change, chills, fatigue, fever and unexpected weight change.   HENT:  Negative for hearing loss.    Eyes:  Negative for visual disturbance.        +CL, vision good, last eye exam 2024.    Respiratory:  Negative for cough, choking, shortness of breath and wheezing.    Cardiovascular:  Negative for chest pain and palpitations.   Gastrointestinal:  Negative for abdominal pain, blood in stool, constipation, diarrhea, nausea and vomiting.   Genitourinary:  Negative for dysuria, frequency, menstrual problem, vaginal bleeding and vaginal discharge.   Musculoskeletal:  Negative for arthralgias and joint swelling.   Skin:  Negative for rash.   Neurological:  Negative for syncope, weakness, light-headedness and numbness.   Hematological:  Negative for adenopathy.   Psychiatric/Behavioral:  Positive for dysphoric mood. Negative for sleep disturbance and suicidal ideas. The patient is nervous/anxious.         Seeing therapist  every 2 weeks. Declined med       Objective   BP 126/80 (BP Site: Left arm, Patient Position: Sitting, Cuff Size: Medium)   Pulse 80   Temp 98.3 F (36.8 C) (Temporal)   Ht 1.524 m (5')   Wt 68.9 kg (151 lb 12.8 oz)   LMP 09/15/2023 (Within Days)   BMI 29.65 kg/m   Physical Exam  Constitutional:       General: She is not in acute distress.     Appearance: She is well-developed.   HENT:      Head: Normocephalic.      Right Ear: External ear normal.      Left Ear: External ear normal.      Mouth/Throat:      Pharynx: No oropharyngeal exudate.   Eyes:      General: No scleral icterus.     Conjunctiva/sclera: Conjunctivae normal.      Pupils: Pupils are equal, round, and reactive to light.   Neck:      Thyroid: No thyromegaly.  Vascular: No carotid bruit or JVD.   Cardiovascular:      Rate and Rhythm: Normal rate and regular rhythm.      Heart sounds: Normal heart sounds. No murmur heard.     No friction rub. No gallop.   Pulmonary:      Effort: Pulmonary effort is normal. No respiratory distress.      Breath sounds: Normal breath sounds. No wheezing or rales.   Abdominal:      General: Bowel sounds are normal. There is no distension.      Palpations: Abdomen is soft. There is no mass.      Tenderness: There is no abdominal tenderness.   Genitourinary:     Comments: Deferred, pt sees ob/gyn  Musculoskeletal:         General: No tenderness. Normal range of motion.      Cervical back: Normal range of motion and neck supple.   Lymphadenopathy:      Cervical: No cervical adenopathy.      Upper Body:      Right upper body: No supraclavicular, axillary or epitrochlear adenopathy.      Left upper body: No supraclavicular, axillary or epitrochlear adenopathy.   Skin:     General: Skin is warm.      Findings: No rash.   Neurological:      Mental Status: She is alert and oriented to person, place, and time.      Cranial Nerves: No cranial nerve deficit.      Motor: No abnormal muscle tone.      Coordination:  Coordination normal.         Assessment/Plan     Assessment & Plan  Annual physical exam    Orders:    CBC with Differential (Order); Future    Comprehensive Metabolic Panel; Future    Hyperlipidemia, unspecified hyperlipidemia type    Orders:    Lipid Panel; Future    Vitamin D  deficiency    Orders:    Vitamin D , 25 OH, Total; Future    Diabetes mellitus screening    Orders:    Hemoglobin A1C; Future    History of ulcerative colitis    Orders:    C Reactive Protein; Future    Labs ordered  Low carb, low cholesterol, low calorie diet, exercise, weight reduction  Annual eye exam   F/u GI as directed  Further recommendations pending test results.  F/u 1 yr with PCP         [1]   Patient Active Problem List  Diagnosis    Fibrocystic breast changes of both breasts    History of keloid of skin    Ulcerative proctitis (CMS/HCC)    Preventative health care   [2]   No current outpatient medications on file.     No current facility-administered medications for this visit.   [3]   Past Medical History:  Diagnosis Date    Ulcerative proctitis (CMS/HCC)    [4]   Past Surgical History:  Procedure Laterality Date    KELOID EXCISION Bilateral 2013    ears   [5]   Family History  Problem Relation Name Age of Onset    No known problems Mother      No known problems Father      No known problems Sister twin     No known problems Brother      Colon cancer Paternal Uncle      Pancreatic cancer Maternal Grandfather  Stomach cancer Maternal Grandfather      Diabetes Paternal Grandfather      Heart disease Paternal Grandfather      Inflammatory bowel disease Paternal Grandfather      Breast cancer Neg Hx     [6]   Social History  Tobacco Use    Smoking status: Never    Smokeless tobacco: Never    Tobacco comments:     Never Smoked   Vaping Use    Vaping status: Former   Substance Use Topics    Alcohol use: Yes     Alcohol/week: 0.0 - 1.0 standard drinks of alcohol     Comment: once /month; 1 -2 drinks/episode    Drug use: Never

## 2023-10-11 ENCOUNTER — Ambulatory Visit (INDEPENDENT_AMBULATORY_CARE_PROVIDER_SITE_OTHER): Payer: Self-pay | Admitting: Internal Medicine

## 2023-10-11 LAB — LAB USE ONLY - CBC WITH DIFFERENTIAL
Absolute Basophils: 0.04 x10 3/uL (ref 0.00–0.08)
Absolute Eosinophils: 0.09 x10 3/uL (ref 0.00–0.44)
Absolute Immature Granulocytes: 0.01 x10 3/uL (ref 0.00–0.07)
Absolute Lymphocytes: 1.89 x10 3/uL (ref 0.42–3.22)
Absolute Monocytes: 0.44 x10 3/uL (ref 0.21–0.85)
Absolute Neutrophils: 3.47 x10 3/uL (ref 1.10–6.33)
Absolute nRBC: 0 x10 3/uL (ref <=0.00)
Basophils %: 0.7 %
Eosinophils %: 1.5 %
Hematocrit: 39.7 % (ref 34.7–43.7)
Hemoglobin: 13.2 g/dL (ref 11.4–14.8)
Immature Granulocytes %: 0.2 %
Lymphocytes %: 31.8 %
MCH: 30.8 pg (ref 25.1–33.5)
MCHC: 33.2 g/dL (ref 31.5–35.8)
MCV: 92.5 fL (ref 78.0–96.0)
MPV: 10.7 fL (ref 8.9–12.5)
Monocytes %: 7.4 %
Neutrophils %: 58.4 %
Platelet Count: 327 x10 3/uL (ref 142–346)
Preliminary Absolute Neutrophil Count: 3.47 x10 3/uL (ref 1.10–6.33)
RBC: 4.29 x10 6/uL (ref 3.90–5.10)
RDW: 13 % (ref 11–15)
WBC: 5.94 x10 3/uL (ref 3.10–9.50)
nRBC %: 0 /100{WBCs} (ref <=0.0)

## 2023-10-11 LAB — COMPREHENSIVE METABOLIC PANEL
ALT: 19 U/L (ref <=55)
AST (SGOT): 24 U/L (ref <=41)
Albumin/Globulin Ratio: 1.7 (ref 0.9–2.2)
Albumin: 4.6 g/dL (ref 3.5–4.9)
Alkaline Phosphatase: 49 U/L (ref 37–117)
Anion Gap: 10 (ref 5.0–15.0)
BUN: 13 mg/dL (ref 7–21)
Bilirubin, Total: 0.8 mg/dL (ref 0.2–1.2)
CO2: 24 meq/L (ref 17–29)
Calcium: 10 mg/dL (ref 8.5–10.5)
Chloride: 104 meq/L (ref 99–111)
Creatinine: 0.8 mg/dL (ref 0.4–1.0)
GFR: >60 mL/min/1.73 m2 (ref >=60.0)
Globulin: 2.7 g/dL (ref 2.0–3.6)
Glucose: 84 mg/dL (ref 70–100)
Hemolysis Index: 1 {index}
Potassium: 4.1 meq/L (ref 3.5–5.3)
Protein, Total: 7.3 g/dL (ref 6.0–8.3)
Sodium: 138 meq/L (ref 135–145)

## 2023-10-11 LAB — HEMOGLOBIN A1C
Average Estimated Glucose: 99.7 mg/dL
Hemoglobin A1C: 5.1 % (ref 4.6–5.6)

## 2023-10-11 LAB — LIPID PANEL
Cholesterol / HDL Ratio: 3 {index}
Cholesterol: 186 mg/dL (ref <=199)
HDL: 62 mg/dL (ref >=40)
LDL Calculated: 117 mg/dL — ABNORMAL HIGH (ref 0–99)
Triglycerides: 37 mg/dL (ref 34–149)
VLDL Calculated: 6 mg/dL — ABNORMAL LOW (ref 10–40)

## 2023-10-11 LAB — VITAMIN D, 25 OH, TOTAL: Vitamin D 25-OH, Total: 37 ng/mL (ref 30–100)

## 2023-10-11 LAB — C-REACTIVE PROTEIN: C-Reactive Protein: <0.1 mg/dL (ref 0.0–1.1)

## 2023-10-11 NOTE — Progress Notes (Signed)
 Cholesterol profile LDL is borderline high, rest of profile is good  Glucose/Hba1c are normal  Vitamin D  level is normal  Rest of labs are normal    Rec:  1. Low carb, low cholesterol, low calorie diet, exercise, weight reduction 2. Continue meds  3. Keep follow up visit

## 2023-10-15 ENCOUNTER — Encounter (INDEPENDENT_AMBULATORY_CARE_PROVIDER_SITE_OTHER): Payer: Self-pay

## 2023-11-15 ENCOUNTER — Encounter (INDEPENDENT_AMBULATORY_CARE_PROVIDER_SITE_OTHER): Payer: Self-pay

## 2023-12-10 ENCOUNTER — Ambulatory Visit (INDEPENDENT_AMBULATORY_CARE_PROVIDER_SITE_OTHER): Admitting: Family

## 2023-12-10 ENCOUNTER — Encounter (INDEPENDENT_AMBULATORY_CARE_PROVIDER_SITE_OTHER): Payer: Self-pay

## 2023-12-10 VITALS — BP 136/86 | HR 83 | Temp 98.5°F | Resp 19 | Ht 59.0 in | Wt 151.4 lb

## 2023-12-10 DIAGNOSIS — Z202 Contact with and (suspected) exposure to infections with a predominantly sexual mode of transmission: Secondary | ICD-10-CM

## 2023-12-10 LAB — LAB USE ONLY - GOLD SST HOLD TUBE

## 2023-12-10 LAB — LAB USE ONLY - LAVENDER - EDTA HOLD TUBE

## 2023-12-10 NOTE — Progress Notes (Signed)
 [facility]  OFFICE NOTE         Subjective   Historian: Patient      Chief Complaint   Patient presents with    STI Screening     Pt would like to be checked for STD's-- no symptoms         History of Present Illness  Katherine Evans is a 38 year old female who presents for STI testing.    She requests a whole panel STI test with blood work for hepatitis B, hepatitis C, syphilis, and HIV and swabs for gonorrhea, chlamydia, mycoplasma, and trichomonas vaginalis. She is sexually active with one female partner and has no genital symptoms, including lesions or vaginal discharge.    History:  Medications and Allergies reviewed.   Pertinent Past Medical, Surgical, Family and Social History were reviewed.          Objective     Vitals:    12/10/23 1012   BP: 136/86   BP Site: Right arm   Patient Position: Sitting   Cuff Size: Medium   Pulse: 83   Resp: 19   Temp: 98.5 F (36.9 C)   TempSrc: Tympanic   SpO2: 99%   Weight: 68.7 kg (151 lb 6.4 oz)   Height: 1.499 m (4' 11)      Body mass index is 30.58 kg/m.          Physical Exam   Physical Exam    Urgent Care Course   There were no labs reviewed with this patient during the visit.    There were no x-rays reviewed with this patient during the visit.      Procedures   Procedures            Assessment & Plan  General Health Maintenance  Undergoing routine STI screening.  - Ordered blood tests for hepatitis B, hepatitis C, syphilis, and HIV.  - Ordered swab tests for gonorrhea, chlamydia, mycoplasma, and trichomonas vaginalis.  - Instructed her to self-swab for STI testing.  - Advised to inform partner if STI detected.  - Informed results available on MyChart; will contact if treatment needed.       Katherine Evans was seen today for sti screening.    Diagnoses and all orders for this visit:    Encounter for assessment of STD exposure         The indications for early follow-up with PCP and return to UC were discussed. Patient/family received education on the  working diagnosis, diagnostic uncertainties, and proposed treatment plan. Indications for emergency evaluation in the ED were reviewed. Written and verbal discharge instructions were provided and discussed and all questions from the patient/family were addressed, with no apparent barriers.      Verbal consent obtained to record this visit.

## 2023-12-11 ENCOUNTER — Ambulatory Visit (INDEPENDENT_AMBULATORY_CARE_PROVIDER_SITE_OTHER): Payer: Self-pay | Admitting: Medical

## 2023-12-11 LAB — HEPATITIS B SURFACE (HBV) ANTIBODY, QUANTITATIVE: Hepatitis B Surface Antibody: 651.98 m[IU]/mL

## 2023-12-11 LAB — HEPATITIS B (HBV) SURFACE ANTIGEN WITH REFLEX TO CONFIRMATION: Hepatitis B Surface Antigen: NONREACTIVE

## 2023-12-11 LAB — HEPATITIS C ANTIBODY, TOTAL: Hepatitis C Antibody: NONREACTIVE

## 2023-12-11 LAB — SYPHILIS SCREEN, IGG AND IGM: Syphilis Screen IgG and IgM: NONREACTIVE

## 2023-12-11 LAB — HIV-1/2, ANTIGEN AND ANTIBODY WITH REFLEX TO CONFIRMATION: HIV Ag/Ab 4th Generation: NONREACTIVE

## 2023-12-13 LAB — CHLAMYDIA TRACHOMATIS, NEISSERIA GONORRHOEAE, MYCOPLASMA GENITALIUM AND TRICHOMONAS VAGINALIS
Chlamydia trachomatis DNA: NEGATIVE
Mycoplasma genitalium DNA: NEGATIVE
Neisseria gonorrhoeae DNA: NEGATIVE
Trichomonas vaginalis DNA: NEGATIVE

## 2023-12-15 ENCOUNTER — Encounter (INDEPENDENT_AMBULATORY_CARE_PROVIDER_SITE_OTHER): Payer: Self-pay

## 2024-01-15 ENCOUNTER — Encounter (INDEPENDENT_AMBULATORY_CARE_PROVIDER_SITE_OTHER): Payer: Self-pay

## 2024-01-20 ENCOUNTER — Encounter (INDEPENDENT_AMBULATORY_CARE_PROVIDER_SITE_OTHER): Payer: Self-pay | Admitting: Family Medicine

## 2024-01-22 ENCOUNTER — Ambulatory Visit (INDEPENDENT_AMBULATORY_CARE_PROVIDER_SITE_OTHER): Admitting: Family Medicine

## 2024-01-22 ENCOUNTER — Encounter (INDEPENDENT_AMBULATORY_CARE_PROVIDER_SITE_OTHER): Payer: Self-pay | Admitting: Family Medicine

## 2024-01-22 VITALS — BP 130/77 | HR 97 | Temp 97.6°F | Wt 148.6 lb

## 2024-01-22 DIAGNOSIS — L509 Urticaria, unspecified: Secondary | ICD-10-CM

## 2024-01-22 NOTE — Progress Notes (Signed)
 [facility] [place]           Subjective     Chief Complaint   Patient presents with    Mass     Lower Bilateral Legs. No Pain     History of Present Illness  Katherine Evans is a 39 year old female who presents with bumps on her lower legs.    Approximately one month ago, she noticed subcutaneous bumps on her lower legs, with two on the right leg and one on the left. Initially, these bumps were warm to the touch and occasionally itchy, but not painful. Over time, they have decreased in size and are now barely detectable. One bump was scratched, resulting in minor bleeding. There was no history of trauma to the area, and no similar symptoms were noted elsewhere on her body or previously.    Her mother experienced similar symptoms a few weeks ago and is currently being tested for Von Willebrand disease. She has no known allergies and has not changed any personal care products recently. . The bumps have since resolved and are currently undetectable.  The bumps were associated with some itch, but no pain.    Review of Systems    Objective   BP 130/77 (BP Site: Left arm, Patient Position: Sitting, Cuff Size: Medium)   Pulse 97   Temp 97.6 F (36.4 C) (Temporal)   Wt 67.4 kg (148 lb 9.6 oz)   BMI 30.01 kg/m   Physical Exam  Skin:          Comments: Areas of patient reported bumps depicted; currently no lesions noted.  No skin discoloration.         Physical Exam       Results        Assessment/Plan     Assessment & Plan  Urticaria  Suspected urticaria on lower legs with no clear trigger identified. Lesions resolved, not detectable on exam.     Dilpreet was seen today for mass.    Diagnoses and all orders for this visit:    Urticaria  -     CBC with Differential (Order); Future  -     C Reactive Protein; Future  -     Sedimentation Rate (ESR); Future  -     Thyroid Stimulating Hormone (TSH) with Reflex to Free T4; Future  -     Hepatitis B Surface (HBV) Antibody, Quantitative; Future  -     Hepatitis B  (HBV) Core Antibody, IgM; Future  -     Hepatitis B (HBV) Surface Antigen with Reflex to Confirmation; Future  -     Hepatitis C Antibody, Total; Future  -     Gold SST Hold Tube; Future  -     CBC with Differential (Order)  -     C Reactive Protein  -     Sedimentation Rate (ESR)  -     Thyroid Stimulating Hormone (TSH) with Reflex to Free T4  -     Hepatitis B Surface (HBV) Antibody, Quantitative  -     Hepatitis B (HBV) Core Antibody, IgM  -     Hepatitis B (HBV) Surface Antigen with Reflex to Confirmation  -     Hepatitis C Antibody, Total  -     Gold SST Hold Tube          Verbal consent obtained to record this visit.

## 2024-01-22 NOTE — Progress Notes (Signed)
 Have you seen any specialists/other providers since your last visit with [place]?    No      The patient was informed that the following HM items are still outstanding:   There are no preventive care reminders to display for this patient.

## 2024-01-23 LAB — THYROID STIMULATING HORMONE (TSH) WITH REFLEX TO FREE T4: TSH: 1.19 u[IU]/mL (ref 0.35–4.94)

## 2024-01-23 LAB — SEDIMENTATION RATE: Sed Rate: 9 mm/h (ref <=20)

## 2024-01-23 LAB — LAB USE ONLY - CBC WITH DIFFERENTIAL
Absolute Basophils: 0.05 x10 3/uL (ref 0.00–0.08)
Absolute Eosinophils: 0.28 x10 3/uL (ref 0.00–0.44)
Absolute Immature Granulocytes: 0.01 x10 3/uL (ref 0.00–0.07)
Absolute Lymphocytes: 2.53 x10 3/uL (ref 0.42–3.22)
Absolute Monocytes: 0.41 x10 3/uL (ref 0.21–0.85)
Absolute Neutrophils: 2.92 x10 3/uL (ref 1.10–6.33)
Absolute nRBC: 0 x10 3/uL (ref <=0.00)
Basophils %: 0.8 %
Eosinophils %: 4.5 %
Hematocrit: 37.3 % (ref 34.7–43.7)
Hemoglobin: 12.5 g/dL (ref 11.4–14.8)
Immature Granulocytes %: 0.2 %
Lymphocytes %: 40.8 %
MCH: 30.5 pg (ref 25.1–33.5)
MCHC: 33.5 g/dL (ref 31.5–35.8)
MCV: 91 fL (ref 78.0–96.0)
MPV: 10.6 fL (ref 8.9–12.5)
Monocytes %: 6.6 %
Neutrophils %: 47.1 %
Platelet Count: 325 x10 3/uL (ref 142–346)
Preliminary Absolute Neutrophil Count: 2.92 x10 3/uL (ref 1.10–6.33)
RBC: 4.1 x10 6/uL (ref 3.90–5.10)
RDW: 13 % (ref 11–15)
WBC: 6.2 x10 3/uL (ref 3.10–9.50)
nRBC %: 0 /100{WBCs} (ref <=0.0)

## 2024-01-23 LAB — HEPATITIS C ANTIBODY, TOTAL: Hepatitis C Antibody: NONREACTIVE

## 2024-01-23 LAB — HEPATITIS B SURFACE (HBV) ANTIBODY, QUANTITATIVE: Hepatitis B Surface Antibody: 650.82 m[IU]/mL

## 2024-01-23 LAB — LAB USE ONLY - GOLD SST HOLD TUBE

## 2024-01-23 LAB — HEPATITIS B CORE ANTIBODY, IGM: Hepatitis B Core IgM: NONREACTIVE

## 2024-01-23 LAB — C-REACTIVE PROTEIN: C-Reactive Protein: <0.1 mg/dL (ref 0.0–1.1)

## 2024-01-23 LAB — HEPATITIS B (HBV) SURFACE ANTIGEN WITH REFLEX TO CONFIRMATION: Hepatitis B Surface Antigen: NONREACTIVE

## 2024-01-26 ENCOUNTER — Ambulatory Visit (INDEPENDENT_AMBULATORY_CARE_PROVIDER_SITE_OTHER): Payer: Self-pay | Admitting: Family Medicine
